# Patient Record
Sex: Male | Born: 1990
Health system: Southern US, Community
[De-identification: ages and names within clinical notes are randomized; demographics above are authoritative.]

## PROBLEM LIST (undated history)

## (undated) DIAGNOSIS — M549 Dorsalgia, unspecified: Secondary | ICD-10-CM

## (undated) DIAGNOSIS — R7303 Prediabetes: Secondary | ICD-10-CM

## (undated) HISTORY — DX: Dorsalgia, unspecified: M54.9

## (undated) HISTORY — DX: Prediabetes: R73.03

## (undated) HISTORY — PX: WISDOM TOOTH EXTRACTION: SHX21

---
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
## 2011-05-07 ENCOUNTER — Ambulatory Visit (INDEPENDENT_AMBULATORY_CARE_PROVIDER_SITE_OTHER): Payer: Medicaid Other

## 2011-05-07 ENCOUNTER — Inpatient Hospital Stay (INDEPENDENT_AMBULATORY_CARE_PROVIDER_SITE_OTHER)
Admission: RE | Admit: 2011-05-07 | Discharge: 2011-05-07 | Disposition: A | Discharge status: Home / Self Care | Payer: Medicaid Other | Source: Ambulatory Visit | Attending: Family Medicine | Admitting: Family Medicine

## 2011-05-07 DIAGNOSIS — J069 Acute upper respiratory infection, unspecified: Secondary | ICD-10-CM

## 2011-05-07 DIAGNOSIS — J40 Bronchitis, not specified as acute or chronic: Secondary | ICD-10-CM

## 2013-03-21 ENCOUNTER — Emergency Department (HOSPITAL_COMMUNITY): Payer: Medicaid Other

## 2013-03-21 ENCOUNTER — Encounter (HOSPITAL_COMMUNITY): Payer: Self-pay | Admitting: Emergency Medicine

## 2013-03-21 ENCOUNTER — Emergency Department (HOSPITAL_COMMUNITY)
Admission: EM | Admit: 2013-03-21 | Discharge: 2013-03-21 | Disposition: A | Discharge status: Home / Self Care | Payer: Medicaid Other | Attending: Emergency Medicine | Admitting: Emergency Medicine

## 2013-03-21 DIAGNOSIS — S01112A Laceration without foreign body of left eyelid and periocular area, initial encounter: Secondary | ICD-10-CM

## 2013-03-21 DIAGNOSIS — S0511XA Contusion of eyeball and orbital tissues, right eye, initial encounter: Secondary | ICD-10-CM

## 2013-03-21 DIAGNOSIS — S058X9A Other injuries of unspecified eye and orbit, initial encounter: Secondary | ICD-10-CM | POA: Insufficient documentation

## 2013-03-21 DIAGNOSIS — S0512XA Contusion of eyeball and orbital tissues, left eye, initial encounter: Secondary | ICD-10-CM

## 2013-03-21 DIAGNOSIS — Z23 Encounter for immunization: Secondary | ICD-10-CM | POA: Insufficient documentation

## 2013-03-21 DIAGNOSIS — S022XXA Fracture of nasal bones, initial encounter for closed fracture: Secondary | ICD-10-CM

## 2013-03-21 DIAGNOSIS — S0010XA Contusion of unspecified eyelid and periocular area, initial encounter: Secondary | ICD-10-CM | POA: Insufficient documentation

## 2013-03-21 MED ORDER — SODIUM CHLORIDE 0.9 % IV BOLUS (SEPSIS)
1000.0000 mL | Freq: Once | INTRAVENOUS | Status: AC
  Administered 2013-03-21: 1000 mL via INTRAVENOUS

## 2013-03-21 MED ORDER — IBUPROFEN 800 MG PO TABS: 800.0000 mg | ORAL_TABLET | Freq: Four times a day (QID) | ORAL | Status: DC | PRN

## 2013-03-21 MED ORDER — SODIUM CHLORIDE 0.9 % IV BOLUS (SEPSIS): 1000.0000 mL | Freq: Once | INTRAVENOUS | Status: DC

## 2013-03-21 MED ORDER — TRAMADOL HCL 50 MG PO TABS: 50.0000 mg | ORAL_TABLET | Freq: Four times a day (QID) | ORAL | Status: DC | PRN

## 2013-03-21 MED ORDER — ERYTHROMYCIN 5 MG/GM OP OINT
TOPICAL_OINTMENT | Freq: Once | OPHTHALMIC | Status: AC
  Administered 2013-03-21: 1 via OPHTHALMIC
  Filled 2013-03-21: qty 1

## 2013-03-21 MED ORDER — MORPHINE SULFATE 4 MG/ML IJ SOLN
4.0000 mg | Freq: Once | INTRAMUSCULAR | Status: AC
  Administered 2013-03-21: 4 mg via INTRAVENOUS
  Filled 2013-03-21: qty 1

## 2013-03-21 MED ORDER — TETANUS-DIPHTH-ACELL PERTUSSIS 5-2.5-18.5 LF-MCG/0.5 IM SUSP
0.5000 mL | Freq: Once | INTRAMUSCULAR | Status: AC
  Administered 2013-03-21: 0.5 mL via INTRAMUSCULAR
  Filled 2013-03-21: qty 0.5

## 2013-03-21 MED ORDER — OXYCODONE-ACETAMINOPHEN 5-325 MG PO TABS
1.0000 | ORAL_TABLET | Freq: Once | ORAL | Status: AC
  Administered 2013-03-21: 1 via ORAL
  Filled 2013-03-21: qty 1

## 2013-03-21 NOTE — Procedures (Signed)
Anesthesia: Proparicaine and local 2% lidocaine with epinephrine, 2 mL. EBL: Less than 5 mL Complications: None.  Description: Eyelids and periocular area were cleaned with povidone iodine. Sterile drape was applied. Laceration was explored with forceps. Posterior aspect of laceration through tarsus extended more superiorly than anterior aspect. Westcott scissors were used to dissect orbicularis from anterior face of tarsal plate to allow closure. Two sutures of 6-0 Vicryl were placed in a partial thickness fashion through the tarsus to reapproximate. A 6-0 vicryl suture was then placed at the lid margin to reapproximate the gray line, and suture end was cut long. Two sutures of 6-0 chromic gut were placed to close the skin and incorporate the tail of the vicryl from the marginal suture. Drape removed and area dressed with Erythromycin ophthalmic ointment.
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
## 2013-03-21 NOTE — ED Notes (Signed)
Optho consult     Brandt Loosen, MD 03/21/13 [phone]
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
## 2014-08-08 ENCOUNTER — Encounter (HOSPITAL_COMMUNITY): Payer: Self-pay | Admitting: Emergency Medicine

## 2014-08-08 ENCOUNTER — Emergency Department (INDEPENDENT_AMBULATORY_CARE_PROVIDER_SITE_OTHER): Payer: BC Managed Care – PPO

## 2014-08-08 ENCOUNTER — Emergency Department (INDEPENDENT_AMBULATORY_CARE_PROVIDER_SITE_OTHER)
Admission: EM | Admit: 2014-08-08 | Discharge: 2014-08-08 | Disposition: A | Discharge status: Home / Self Care | Payer: BC Managed Care – PPO | Source: Home / Self Care | Attending: Family Medicine | Admitting: Family Medicine

## 2014-08-08 DIAGNOSIS — S6980XA Other specified injuries of unspecified wrist, hand and finger(s), initial encounter: Secondary | ICD-10-CM

## 2014-08-08 DIAGNOSIS — S6990XA Unspecified injury of unspecified wrist, hand and finger(s), initial encounter: Secondary | ICD-10-CM

## 2014-08-08 DIAGNOSIS — S6992XA Unspecified injury of left wrist, hand and finger(s), initial encounter: Secondary | ICD-10-CM

## 2014-08-08 DIAGNOSIS — Y9362 Activity, american flag or touch football: Secondary | ICD-10-CM

## 2014-08-08 MED ORDER — DICLOFENAC SODIUM 75 MG PO TBEC
75.0000 mg | DELAYED_RELEASE_TABLET | Freq: Two times a day (BID) | ORAL | Status: DC | PRN
Start: 2014-08-08 — End: 2016-05-21

## 2014-08-08 NOTE — Discharge Instructions (Signed)
Thank you for coming in today. Take diclofenac twice daily as needed.  Followup with the hand surgery practice.   Contusion A contusion is a deep bruise. Contusions are the result of an injury that caused bleeding under the skin. The contusion may turn blue, purple, or yellow. Minor injuries will give you a painless contusion, but more severe contusions may stay painful and swollen for a few weeks.  CAUSES  A contusion is usually caused by a blow, trauma, or direct force to an area of the body. SYMPTOMS   Swelling and redness of the injured area.  Bruising of the injured area.  Tenderness and soreness of the injured area.  Pain. DIAGNOSIS  The diagnosis can be made by taking a history and physical exam. An X-ray, CT scan, or MRI may be needed to determine if there were any associated injuries, such as fractures. TREATMENT  Specific treatment will depend on what area of the body was injured. In general, the best treatment for a contusion is resting, icing, elevating, and applying cold compresses to the injured area. Over-the-counter medicines may also be recommended for pain control. Ask your caregiver what the best treatment is for your contusion. HOME CARE INSTRUCTIONS   Put ice on the injured area.  Put ice in a plastic bag.  Place a towel between your skin and the bag.  Leave the ice on for 15-20 minutes, 3-4 times a day, or as directed by your health care provider.  Only take over-the-counter or prescription medicines for pain, discomfort, or fever as directed by your caregiver. Your caregiver may recommend avoiding anti-inflammatory medicines (aspirin, ibuprofen, and naproxen) for 48 hours because these medicines may increase bruising.  Rest the injured area.  If possible, elevate the injured area to reduce swelling. SEEK IMMEDIATE MEDICAL CARE IF:   You have increased bruising or swelling.  You have pain that is getting worse.  Your swelling or pain is not relieved  with medicines. MAKE SURE YOU:   Understand these instructions.  Will watch your condition.  Will get help right away if you are not doing well or get worse. Document Released: 09/18/2005 Document Revised: 12/14/2013 Document Reviewed: 10/14/2011 [facility] Patient Information 2015 [employer], [place]. This information is not intended to replace advice given to you by your health care provider. Make sure you discuss any questions you have with your health care provider.

## 2014-08-08 NOTE — ED Provider Notes (Signed)
Jose Malone is a 23 y.o. right-hand-dominant male who presents to Urgent Care today for Left thumb pain. Patient dislocated his left thumb at the interphalangeal joint approximately 2 months ago while playing flag football. He relocated himself. Since the initial injury is noted continued pain and swelling at the thumb. He denies any radiating pain weakness or numbness. He's tried some ibuprofen which helps some.   History reviewed. No pertinent past medical history. History  Substance Use Topics  . Smoking status: Never Smoker   . Smokeless tobacco: Not on file  . Alcohol Use: Yes   ROS as above Medications: No current facility-administered medications for this encounter.   Current Outpatient Prescriptions  Medication Sig Dispense Refill  . diclofenac (VOLTAREN) 75 MG EC tablet Take 1 tablet (75 mg total) by mouth 2 (two) times daily as needed.  30 tablet  0  . ibuprofen (ADVIL,MOTRIN) 800 MG tablet Take 1 tablet (800 mg total) by mouth every 6 (six) hours as needed for pain.  21 tablet  0  . traMADol (ULTRAM) 50 MG tablet Take 1 tablet (50 mg total) by mouth every 6 (six) hours as needed for pain.  30 tablet  0    Exam:  BP 130/85  Pulse 69  Temp(Src) 98.5 F (36.9 C) (Oral)  Resp 16  SpO2 98% Gen: Well NAD Left thumb: normal appearing with some swelling at the interphalangeal joint. Mildly tender. Normal motion. Capillary refill sensation are intact. Pulses are intact at the wrist.  No results found for this or any previous visit (from the past 24 hour(s)). Dg Finger Thumb Left  08/08/2014   CLINICAL DATA:  Pain post trauma  EXAM: LEFT THUMB 2+V  COMPARISON:  None.  FINDINGS: Frontal, oblique, and lateral views were obtained. No fracture or dislocation. Joint spaces appear intact. No erosive change.  IMPRESSION: No abnormality noted.   Electronically Signed   By: Bretta BangWilliam  Woodruff M.D.   On: 08/08/2014 14:09    Assessment and Plan: 23 y.o. male with left thumb injury. Continue  to have pain. Plan for followup with hand surgery clinic. NSAIDs for pain control as needed.  Discussed warning signs or symptoms. Please see discharge instructions. Patient expresses understanding.   This note was created using Conservation officer, historic buildingsDragon voice recognition software. Any transcription errors are unintended.    Rodolph BongEvan S Calder Oblinger, MD 08/08/14 [id number]

## 2014-08-08 NOTE — ED Notes (Signed)
Pt c/o left thumb inj/dislocation back in June 20th?? Reports she was playing flag football; hyperextended thumb teammate placed thumb back in place Alert; no signs of acute distress.

## 2015-03-19 ENCOUNTER — Encounter (HOSPITAL_COMMUNITY): Payer: Self-pay | Admitting: Emergency Medicine

## 2015-03-19 ENCOUNTER — Emergency Department (HOSPITAL_COMMUNITY)
Admission: EM | Admit: 2015-03-19 | Discharge: 2015-03-20 | Disposition: A | Discharge status: Home / Self Care | Payer: BLUE CROSS/BLUE SHIELD | Attending: Emergency Medicine | Admitting: Emergency Medicine

## 2015-03-19 DIAGNOSIS — Z791 Long term (current) use of non-steroidal anti-inflammatories (NSAID): Secondary | ICD-10-CM | POA: Insufficient documentation

## 2015-03-19 DIAGNOSIS — J069 Acute upper respiratory infection, unspecified: Secondary | ICD-10-CM | POA: Insufficient documentation

## 2015-03-19 DIAGNOSIS — Z79899 Other long term (current) drug therapy: Secondary | ICD-10-CM | POA: Insufficient documentation

## 2015-03-19 DIAGNOSIS — H578 Other specified disorders of eye and adnexa: Secondary | ICD-10-CM | POA: Diagnosis present

## 2015-03-19 DIAGNOSIS — H109 Unspecified conjunctivitis: Secondary | ICD-10-CM | POA: Insufficient documentation

## 2015-03-19 NOTE — ED Notes (Signed)
C/o L eye redness and itching since this morning.  Also reports sore throat since Friday.

## 2015-03-19 NOTE — ED Provider Notes (Signed)
CSN: [id number]     Arrival date & time 03/19/15  2339 History  This chart was scribed for non-physician practitioner, Jaynie Crumble, PA-C working with Dione Booze, MD by Greggory Stallion, ED scribe. This patient was seen in room TR05C/TR05C and the patient's care was started at 11:52 PM.   Chief Complaint  Patient presents with  . Conjunctivitis  . Sore Throat   The history is provided by the patient. No language interpreter was used.    HPI Comments: Jose Malone is a 24 y.o. male who presents to the Emergency Department complaining of left eye redness, itching and aching pain that started this morning. He does not wear contacts or glasses. Pt also reports sore throat and mild nasal congestion that started 2 days ago. He has used cough drops with no relief. Pt denies fever, cough. He denies sick contacts.   History reviewed. No pertinent past medical history. Past Surgical History  Procedure Laterality Date  . Wisdom tooth extraction     No family history on file. History  Substance Use Topics  . Smoking status: Never Smoker   . Smokeless tobacco: Not on file  . Alcohol Use: Yes    Review of Systems  Constitutional: Negative for fever.  HENT: Positive for congestion and sore throat.   Eyes: Positive for pain, redness and itching.  Respiratory: Negative for cough.   All other systems reviewed and are negative.  Allergies  Review of patient's allergies indicates no known allergies.  Home Medications   Prior to Admission medications   Medication Sig Start Date End Date Taking? Authorizing Provider  diclofenac (VOLTAREN) 75 MG EC tablet Take 1 tablet (75 mg total) by mouth 2 (two) times daily as needed. 08/08/14   Rodolph Bong, MD  ibuprofen (ADVIL,MOTRIN) 800 MG tablet Take 1 tablet (800 mg total) by mouth every 6 (six) hours as needed for pain. 03/21/13   Brandt Loosen, MD  traMADol (ULTRAM) 50 MG tablet Take 1 tablet (50 mg total) by mouth every 6 (six) hours as needed for  pain. 03/21/13   Brandt Loosen, MD   BP 124/77 mmHg  Pulse 76  Temp(Src) 98.1 F (36.7 C) (Oral)  Resp 18  Ht  (1.905 m)  Wt 256 lb (116.121 kg)  BMI 32.00 kg/m2  SpO2 96%   Physical Exam  Constitutional: He is oriented to person, place, and time. He appears well-developed and well-nourished. No distress.  HENT:  Head: Normocephalic and atraumatic.  Right Ear: Tympanic membrane and ear canal normal.  Left Ear: Tympanic membrane and ear canal normal.  Oropharynx erythematous. Tonsils NOT enlarged. No exudate.  Eyes: EOM and lids are normal. Pupils are equal, round, and reactive to light. Lids are everted and swept, no foreign bodies found. Left eye exhibits no exudate. No foreign body present in the left eye. Left conjunctiva is injected.  Neck: Neck supple. No tracheal deviation present.  Cardiovascular: Normal rate, regular rhythm and normal heart sounds.   Pulmonary/Chest: Effort normal and breath sounds normal. No respiratory distress.  Musculoskeletal: Normal range of motion.  Lymphadenopathy:    He has no cervical adenopathy.  Neurological: He is alert and oriented to person, place, and time.  Skin: Skin is warm and dry.  Psychiatric: He has a normal mood and affect. His behavior is normal.  Nursing note and vitals reviewed.   ED Course  Procedures (including critical care time)  DIAGNOSTIC STUDIES: Oxygen Saturation is 96% on RA, normal by my interpretation.  COORDINATION OF CARE: 11:55 PM-Discussed treatment plan which includes rapid strep test with pt at bedside and pt agreed to plan.   Labs Review Labs Reviewed - No data to display  Imaging Review No results found.  Visual Acuity    Bilateral Near             Bilateral Distance  20/25           R Near             R Distance  20/15           L Near             L Distance  20/30               MDM   Final diagnoses:  Viral URI  Conjunctivitis, left eye   patient with left eye injection,  itching. No injury. No pain. No visual disturbances. Also with some throat. Exam is unremarkable. Strep screen is negative. Will treat left eye with erythromycin ointment. Symptomatic treatment for his sore throat. Most likely viral in nature. Follow-up with primary care doctor. Vital signs are normal, patient is afebrile.   Filed Vitals:   03/19/15 2344 03/20/15 0046  BP: 124/77 113/52  Pulse: 76 79  Temp: 98.1 F (36.7 C) 97.5 F (36.4 C)  TempSrc: Oral Oral  Resp: 18 16  Height: 6\' 3"  (1.905 m)   Weight: 256 lb (116.121 kg)   SpO2: 96% 98%    I personally performed the services described in this documentation, which was scribed in my presence. The recorded information has been reviewed and is accurate.   Jaynie Crumbleatyana Aubrielle Stroud, PA-C 03/20/15 0126  Dione Boozeavid Glick, MD 03/20/15 0630

## 2015-03-20 LAB — RAPID STREP SCREEN (MED CTR MEBANE ONLY): STREPTOCOCCUS, GROUP A SCREEN (DIRECT): NEGATIVE

## 2015-03-20 MED ORDER — ERYTHROMYCIN 5 MG/GM OP OINT
1.0000 "application " | TOPICAL_OINTMENT | Freq: Four times a day (QID) | OPHTHALMIC | Status: DC
  Administered 2015-03-20: 1 via OPHTHALMIC
  Filled 2015-03-20: qty 3.5

## 2015-03-20 NOTE — Discharge Instructions (Signed)
He uses erythromycin ointment up to 6 times a day in the left eye. Make sure to wash her hands anytime you touch her face. Take ibuprofen or Tylenol for pain. Saltwater gargles for throat. Follow-up with primary care doctor for recheck if not improving.   Conjunctivitis Conjunctivitis is commonly called "pink eye." Conjunctivitis can be caused by bacterial or viral infection, allergies, or injuries. There is usually redness of the lining of the eye, itching, discomfort, and sometimes discharge. There may be deposits of matter along the eyelids. A viral infection usually causes a watery discharge, while a bacterial infection causes a yellowish, thick discharge. Pink eye is very contagious and spreads by direct contact. You may be given antibiotic eyedrops as part of your treatment. Before using your eye medicine, remove all drainage from the eye by washing gently with warm water and cotton balls. Continue to use the medication until you have awakened 2 mornings in a row without discharge from the eye. Do not rub your eye. This increases the irritation and helps spread infection. Use separate towels from other household members. Wash your hands with soap and water before and after touching your eyes. Use cold compresses to reduce pain and sunglasses to relieve irritation from light. Do not wear contact lenses or wear eye makeup until the infection is gone. SEEK MEDICAL CARE IF:   Your symptoms are not better after 3 days of treatment.  You have increased pain or trouble seeing.  The outer eyelids become very red or swollen. Document Released: 01/16/2005 Document Revised: 03/02/2012 Document Reviewed: 12/09/2005 [facility] Patient Information 2015 [employer] [place]. This information is not intended to replace advice given to you by your health care provider. Make sure you discuss any questions you have with your health care provider.  Pharyngitis Pharyngitis is redness, pain, and swelling (inflammation) of  your pharynx.  CAUSES  Pharyngitis is usually caused by infection. Most of the time, these infections are from viruses (viral) and are part of a cold. However, sometimes pharyngitis is caused by bacteria (bacterial). Pharyngitis can also be caused by allergies. Viral pharyngitis may be spread from person to person by coughing, sneezing, and personal items or utensils (cups, forks, spoons, toothbrushes). Bacterial pharyngitis may be spread from person to person by more intimate contact, such as kissing.  SIGNS AND SYMPTOMS  Symptoms of pharyngitis include:   Sore throat.   Tiredness (fatigue).   Low-grade fever.   Headache.  Joint pain and muscle aches.  Skin rashes.  Swollen lymph nodes.  Plaque-like film on throat or tonsils (often seen with bacterial pharyngitis). DIAGNOSIS  Your health care provider will ask you questions about your illness and your symptoms. Your medical history, along with a physical exam, is often all that is needed to diagnose pharyngitis. Sometimes, a rapid strep test is done. Other lab tests may also be done, depending on the suspected cause.  TREATMENT  Viral pharyngitis will usually get better in 3-4 days without the use of medicine. Bacterial pharyngitis is treated with medicines that kill germs (antibiotics).  HOME CARE INSTRUCTIONS   Drink enough water and fluids to keep your urine clear or pale yellow.   Only take over-the-counter or prescription medicines as directed by your health care provider:   If you are prescribed antibiotics, make sure you finish them even if you start to feel better.   Do not take aspirin.   Get lots of rest.   Gargle with 8 oz of salt water ( tsp of salt  per 1 qt of water) as often as every 1-2 hours to soothe your throat.   Throat lozenges (if you are not at risk for choking) or sprays may be used to soothe your throat. SEEK MEDICAL CARE IF:   You have large, tender lumps in your neck.  You have a  rash.  You cough up green, yellow-brown, or bloody spit. SEEK IMMEDIATE MEDICAL CARE IF:   Your neck becomes stiff.  You drool or are unable to swallow liquids.  You vomit or are unable to keep medicines or liquids down.  You have severe pain that does not go away with the use of recommended medicines.  You have trouble breathing (not caused by a stuffy nose). MAKE SURE YOU:   Understand these instructions.  Will watch your condition.  Will get help right away if you are not doing well or get worse. Document Released: 12/09/2005 Document Revised: 09/29/2013 Document Reviewed: 08/16/2013 [facility] Patient Information 2015 [place], [place]. This information is not intended to replace advice given to you by your health care provider. Make sure you discuss any questions you have with your health care provider.

## 2015-03-22 LAB — CULTURE, GROUP A STREP: STREP A CULTURE: NEGATIVE

## 2016-05-21 ENCOUNTER — Ambulatory Visit (INDEPENDENT_AMBULATORY_CARE_PROVIDER_SITE_OTHER): Payer: 59 | Admitting: Family Medicine

## 2016-05-21 VITALS — BP 120/72 | HR 76 | Temp 99.1°F | Resp 18 | Ht 74.0 in | Wt 267.4 lb

## 2016-05-21 DIAGNOSIS — M545 Low back pain, unspecified: Secondary | ICD-10-CM

## 2016-05-21 DIAGNOSIS — R809 Proteinuria, unspecified: Secondary | ICD-10-CM

## 2016-05-21 LAB — POCT URINALYSIS DIP (MANUAL ENTRY)
Bilirubin, UA: NEGATIVE
Glucose, UA: NEGATIVE
Ketones, POC UA: NEGATIVE
Leukocytes, UA: NEGATIVE
Nitrite, UA: NEGATIVE
PH UA: 6
Protein Ur, POC: 100 — AB
RBC UA: NEGATIVE
SPEC GRAV UA: 1.025
Urobilinogen, UA: 1

## 2016-05-21 LAB — COMPREHENSIVE METABOLIC PANEL
ALT: 16 U/L (ref 9–46)
AST: 17 U/L (ref 10–40)
Albumin: 4.3 g/dL (ref 3.6–5.1)
Alkaline Phosphatase: 69 U/L (ref 40–115)
BILIRUBIN TOTAL: 0.8 mg/dL (ref 0.2–1.2)
BUN: 10 mg/dL (ref 7–25)
CALCIUM: 9.2 mg/dL (ref 8.6–10.3)
CHLORIDE: 101 mmol/L (ref 98–110)
CO2: 27 mmol/L (ref 20–31)
CREATININE: 1.18 mg/dL (ref 0.60–1.35)
GLUCOSE: 81 mg/dL (ref 65–99)
Potassium: 3.9 mmol/L (ref 3.5–5.3)
SODIUM: 137 mmol/L (ref 135–146)
Total Protein: 6.9 g/dL (ref 6.1–8.1)

## 2016-05-21 LAB — CK: Total CK: 278 U/L — ABNORMAL HIGH (ref 7–232)

## 2016-05-21 MED ORDER — CYCLOBENZAPRINE HCL 10 MG PO TABS: 10.0000 mg | ORAL_TABLET | Freq: Three times a day (TID) | ORAL | Status: DC | PRN

## 2016-05-21 MED ORDER — HYDROCODONE-ACETAMINOPHEN 5-325 MG PO TABS: 1.0000 | ORAL_TABLET | Freq: Four times a day (QID) | ORAL | Status: DC | PRN

## 2016-05-21 MED ORDER — DICLOFENAC SODIUM 75 MG PO TBEC: 75.0000 mg | DELAYED_RELEASE_TABLET | Freq: Two times a day (BID) | ORAL | Status: DC

## 2016-05-21 NOTE — Patient Instructions (Addendum)
Proteinuria Proteinuria is a condition in which urine contains more protein than is normal. Proteinuria is either a sign that your body is producing too much protein or a sign that there is a problem with the kidneys. Healthy kidneys prevent most substances that the body needs, including proteins, from leaving the bloodstream and ending up in urine. CAUSES  Proteinuria may be caused by a temporary event or condition such as stress, exercise, or fever, and go away on its own. Proteinuria may also be a symptom of a more serious condition or disease. Causes of proteinuria include:  A kidney disease caused by:  Diabetes.  High blood pressure (hypertension).   A disease that affects the immune system, such as lupus.  A genetic disease, such as Alport's syndrome.  Medicines that damage the kidneys, such as long-term nonsteroidal anti-inflammatory drugs (NSAIDs).  Poisoning or exposure to toxic substances.  A reoccurring kidney or urinary infection.  Excess protein production in the body caused by:  Multiple myeloma.  Amyloidosis. SYMPTOMS You may have proteinuria without having noticeable symptoms. If there is a large amount of protein in your urine, your urine may look foamy. You may also notice swelling (edema) in your hands, feet, abdomen, or face. DIAGNOSIS To determine whether you have proteinuria, you will need to provide a urine sample. Your urine will then be tested for too much protein and the main blood protein albumin. If your test shows that you have proteinuria, you may need to take additional tests to determine its cause, how much protein is in your urine, and what type of protein is being lost. Tests may include:  Blood tests.  Urine tests.  A blood pressure measurement.  Imaging tests. TREATMENT  Treatment will depend on the cause of your proteinuria. Your caregiver will discuss treatment options with you after you have been diagnosed. If your proteinuria is mild or  temporary, no treatment may be necessary. HOME CARE INSTRUCTIONS Ask your caregiver if monitoring the level of protein in your urine at home using simple testing strips is appropriate for you. Early detection of proteinuria can lead to early and often successful treatment of the condition causing it.   This information is not intended to replace advice given to you by your health care provider. Make sure you discuss any questions you have with your health care provider.   Document Released: 01/29/2006 Document Revised: 09/02/2012 Document Reviewed: 05/08/2012 Elsevier Interactive Patient Education 2016 [employer] Strain With Rehab A strain is an injury in which a tendon or muscle is torn. The muscles and tendons of the lower back are vulnerable to strains. However, these muscles and tendons are very strong and require a great force to be injured. Strains are classified into three categories. Grade 1 strains cause pain, but the tendon is not lengthened. Grade 2 strains include a lengthened ligament, due to the ligament being stretched or partially ruptured. With grade 2 strains there is still function, although the function may be decreased. Grade 3 strains involve a complete tear of the tendon or muscle, and function is usually impaired. SYMPTOMS   Pain in the lower back.  Pain that affects one side more than the other.  Pain that gets worse with movement and may be felt in the hip, buttocks, or back of the thigh.  Muscle spasms of the muscles in the back.  Swelling along the muscles of the back.  Loss of strength of the back muscles.  Crackling sound (crepitation)  when the muscles are touched. CAUSES  Lower back strains occur when a force is placed on the muscles or tendons that is greater than they can handle. Common causes of injury include:  Prolonged overuse of the muscle-tendon units in the lower back, usually from incorrect posture.  A single violent injury or  force applied to the back. RISK INCREASES WITH:  Sports that involve twisting forces on the spine or a lot of bending at the waist (football, rugby, weightlifting, bowling, golf, tennis, speed skating, racquetball, swimming, running, gymnastics, diving).  Poor strength and flexibility.  Failure to warm up properly before activity.  Family history of lower back pain or disk disorders.  Previous back injury or surgery (especially fusion).  Poor posture with lifting, especially heavy objects.  Prolonged sitting, especially with poor posture. PREVENTION   Learn and use proper posture when sitting or lifting (maintain proper posture when sitting, lift using the knees and legs, not at the waist).  Warm up and stretch properly before activity.  Allow for adequate recovery between workouts.  Maintain physical fitness:  Strength, flexibility, and endurance.  Cardiovascular fitness. PROGNOSIS  If treated properly, lower back strains usually heal within 6 weeks. RELATED COMPLICATIONS   Recurring symptoms, resulting in a chronic problem.  Chronic inflammation, scarring, and partial muscle-tendon tear.  Delayed healing or resolution of symptoms.  Prolonged disability. TREATMENT  Treatment first involves the use of ice and medicine, to reduce pain and inflammation. The use of strengthening and stretching exercises may help reduce pain with activity. These exercises may be performed at home or with a therapist. Severe injuries may require referral to a therapist for further evaluation and treatment, such as ultrasound. Your caregiver may advise that you wear a back brace or corset, to help reduce pain and discomfort. Often, prolonged bed rest results in greater harm then benefit. Corticosteroid injections may be recommended. However, these should be reserved for the most serious cases. It is important to avoid using your back when lifting objects. At night, sleep on your back on a firm  mattress with a pillow placed under your knees. If non-surgical treatment is unsuccessful, surgery may be needed.  MEDICATION   If pain medicine is needed, nonsteroidal anti-inflammatory medicines (aspirin and ibuprofen), or other minor pain relievers (acetaminophen), are often advised.  Do not take pain medicine for 7 days before surgery.  Prescription pain relievers may be given, if your caregiver thinks they are needed. Use only as directed and only as much as you need.  Ointments applied to the skin may be helpful.  Corticosteroid injections may be given by your caregiver. These injections should be reserved for the most serious cases, because they may only be given a certain number of times. HEAT AND COLD  Cold treatment (icing) should be applied for 10 to 15 minutes every 2 to 3 hours for inflammation and pain, and immediately after activity that aggravates your symptoms. Use ice packs or an ice massage.  Heat treatment may be used before performing stretching and strengthening activities prescribed by your caregiver, physical therapist, or athletic trainer. Use a heat pack or a warm water soak. SEEK MEDICAL CARE IF:   Symptoms get worse or do not improve in 2 to 4 weeks, despite treatment.  You develop numbness, weakness, or loss of bowel or bladder function.  New, unexplained symptoms develop. (Drugs used in treatment may produce side effects.) EXERCISES  RANGE OF MOTION (ROM) AND STRETCHING EXERCISES - Low Back Strain Most people  with lower back pain will find that their symptoms get worse with excessive bending forward (flexion) or arching at the lower back (extension). The exercises which will help resolve your symptoms will focus on the opposite motion.  Your physician, physical therapist or athletic trainer will help you determine which exercises will be most helpful to resolve your lower back pain. Do not complete any exercises without first consulting with your caregiver.  Discontinue any exercises which make your symptoms worse until you speak to your caregiver.  If you have pain, numbness or tingling which travels down into your buttocks, leg or foot, the goal of the therapy is for these symptoms to move closer to your back and eventually resolve. Sometimes, these leg symptoms will get better, but your lower back pain may worsen. This is typically an indication of progress in your rehabilitation. Be very alert to any changes in your symptoms and the activities in which you participated in the 24 hours prior to the change. Sharing this information with your caregiver will allow him/her to most efficiently treat your condition.  These exercises may help you when beginning to rehabilitate your injury. Your symptoms may resolve with or without further involvement from your physician, physical therapist or athletic trainer. While completing these exercises, remember:  Restoring tissue flexibility helps normal motion to return to the joints. This allows healthier, less painful movement and activity.  An effective stretch should be held for at least 30 seconds.  A stretch should never be painful. You should only feel a gentle lengthening or release in the stretched tissue. FLEXION RANGE OF MOTION AND STRETCHING EXERCISES: STRETCH - Flexion, Single Knee to Chest   Lie on a firm bed or floor with both legs extended in front of you.  Keeping one leg in contact with the floor, bring your opposite knee to your chest. Hold your leg in place by either grabbing behind your thigh or at your knee.  Pull until you feel a gentle stretch in your lower back. Hold __________ seconds.  Slowly release your grasp and repeat the exercise with the opposite side. Repeat __________ times. Complete this exercise __________ times per day.  STRETCH - Flexion, Double Knee to Chest   Lie on a firm bed or floor with both legs extended in front of you.  Keeping one leg in contact with the  floor, bring your opposite knee to your chest.  Tense your stomach muscles to support your back and then lift your other knee to your chest. Hold your legs in place by either grabbing behind your thighs or at your knees.  Pull both knees toward your chest until you feel a gentle stretch in your lower back. Hold __________ seconds.  Tense your stomach muscles and slowly return one leg at a time to the floor. Repeat __________ times. Complete this exercise __________ times per day.  STRETCH - Low Trunk Rotation  Lie on a firm bed or floor. Keeping your legs in front of you, bend your knees so they are both pointed toward the ceiling and your feet are flat on the floor.  Extend your arms out to the side. This will stabilize your upper body by keeping your shoulders in contact with the floor.  Gently and slowly drop both knees together to one side until you feel a gentle stretch in your lower back. Hold for __________ seconds.  Tense your stomach muscles to support your lower back as you bring your knees back to the starting position.  Repeat the exercise to the other side. Repeat __________ times. Complete this exercise __________ times per day  EXTENSION RANGE OF MOTION AND FLEXIBILITY EXERCISES: STRETCH - Extension, Prone on Elbows   Lie on your stomach on the floor, a bed will be too soft. Place your palms about shoulder width apart and at the height of your head.  Place your elbows under your shoulders. If this is too painful, stack pillows under your chest.  Allow your body to relax so that your hips drop lower and make contact more completely with the floor.  Hold this position for __________ seconds.  Slowly return to lying flat on the floor. Repeat __________ times. Complete this exercise __________ times per day.  RANGE OF MOTION - Extension, Prone Press Ups  Lie on your stomach on the floor, a bed will be too soft. Place your palms about shoulder width apart and at the height  of your head.  Keeping your back as relaxed as possible, slowly straighten your elbows while keeping your hips on the floor. You may adjust the placement of your hands to maximize your comfort. As you gain motion, your hands will come more underneath your shoulders.  Hold this position __________ seconds.  Slowly return to lying flat on the floor. Repeat __________ times. Complete this exercise __________ times per day.  RANGE OF MOTION- Quadruped, Neutral Spine   Assume a hands and knees position on a firm surface. Keep your hands under your shoulders and your knees under your hips. You may place padding under your knees for comfort.  Drop your head and point your tail bone toward the ground below you. This will round out your lower back like an angry cat. Hold this position for __________ seconds.  Slowly lift your head and release your tail bone so that your back sags into a large arch, like an old horse.  Hold this position for __________ seconds.  Repeat this until you feel limber in your lower back.  Now, find your "sweet spot." This will be the most comfortable position somewhere between the two previous positions. This is your neutral spine. Once you have found this position, tense your stomach muscles to support your lower back.  Hold this position for __________ seconds. Repeat __________ times. Complete this exercise __________ times per day.  STRENGTHENING EXERCISES - Low Back Strain These exercises may help you when beginning to rehabilitate your injury. These exercises should be done near your "sweet spot." This is the neutral, low-back arch, somewhere between fully rounded and fully arched, that is your least painful position. When performed in this safe range of motion, these exercises can be used for people who have either a flexion or extension based injury. These exercises may resolve your symptoms with or without further involvement from your physician, physical therapist  or athletic trainer. While completing these exercises, remember:   Muscles can gain both the endurance and the strength needed for everyday activities through controlled exercises.  Complete these exercises as instructed by your physician, physical therapist or athletic trainer. Increase the resistance and repetitions only as guided.  You may experience muscle soreness or fatigue, but the pain or discomfort you are trying to eliminate should never worsen during these exercises. If this pain does worsen, stop and make certain you are following the directions exactly. If the pain is still present after adjustments, discontinue the exercise until you can discuss the trouble with your caregiver. STRENGTHENING - Deep Abdominals, Pelvic Tilt  Lie on  a firm bed or floor. Keeping your legs in front of you, bend your knees so they are both pointed toward the ceiling and your feet are flat on the floor.  Tense your lower abdominal muscles to press your lower back into the floor. This motion will rotate your pelvis so that your tail bone is scooping upwards rather than pointing at your feet or into the floor.  With a gentle tension and even breathing, hold this position for __________ seconds. Repeat __________ times. Complete this exercise __________ times per day.  STRENGTHENING - Abdominals, Crunches   Lie on a firm bed or floor. Keeping your legs in front of you, bend your knees so they are both pointed toward the ceiling and your feet are flat on the floor. Cross your arms over your chest.  Slightly tip your chin down without bending your neck.  Tense your abdominals and slowly lift your trunk high enough to just clear your shoulder blades. Lifting higher can put excessive stress on the lower back and does not further strengthen your abdominal muscles.  Control your return to the starting position. Repeat __________ times. Complete this exercise __________ times per day.  STRENGTHENING -  Quadruped, Opposite UE/LE Lift   Assume a hands and knees position on a firm surface. Keep your hands under your shoulders and your knees under your hips. You may place padding under your knees for comfort.  Find your neutral spine and gently tense your abdominal muscles so that you can maintain this position. Your shoulders and hips should form a rectangle that is parallel with the floor and is not twisted.  Keeping your trunk steady, lift your right hand no higher than your shoulder and then your left leg no higher than your hip. Make sure you are not holding your breath. Hold this position __________ seconds.  Continuing to keep your abdominal muscles tense and your back steady, slowly return to your starting position. Repeat with the opposite arm and leg. Repeat __________ times. Complete this exercise __________ times per day.  STRENGTHENING - Lower Abdominals, Double Knee Lift  Lie on a firm bed or floor. Keeping your legs in front of you, bend your knees so they are both pointed toward the ceiling and your feet are flat on the floor.  Tense your abdominal muscles to brace your lower back and slowly lift both of your knees until they come over your hips. Be certain not to hold your breath.  Hold __________ seconds. Using your abdominal muscles, return to the starting position in a slow and controlled manner. Repeat __________ times. Complete this exercise __________ times per day.  POSTURE AND BODY MECHANICS CONSIDERATIONS - Low Back Strain Keeping correct posture when sitting, standing or completing your activities will reduce the stress put on different body tissues, allowing injured tissues a chance to heal and limiting painful experiences. The following are general guidelines for improved posture. Your physician or physical therapist will provide you with any instructions specific to your needs. While reading these guidelines, remember:  The exercises prescribed by your provider will  help you have the flexibility and strength to maintain correct postures.  The correct posture provides the best environment for your joints to work. All of your joints have less wear and tear when properly supported by a spine with good posture. This means you will experience a healthier, less painful body.  Correct posture must be practiced with all of your activities, especially prolonged sitting and standing. Correct posture is  as important when doing repetitive low-stress activities (typing) as it is when doing a single heavy-load activity (lifting). RESTING POSITIONS Consider which positions are most painful for you when choosing a resting position. If you have pain with flexion-based activities (sitting, bending, stooping, squatting), choose a position that allows you to rest in a less flexed posture. You would want to avoid curling into a fetal position on your side. If your pain worsens with extension-based activities (prolonged standing, working overhead), avoid resting in an extended position such as sleeping on your stomach. Most people will find more comfort when they rest with their spine in a more neutral position, neither too rounded nor too arched. Lying on a non-sagging bed on your side with a pillow between your knees, or on your back with a pillow under your knees will often provide some relief. Keep in mind, being in any one position for a prolonged period of time, no matter how correct your posture, can still lead to stiffness. PROPER SITTING POSTURE In order to minimize stress and discomfort on your spine, you must sit with correct posture. Sitting with good posture should be effortless for a healthy body. Returning to good posture is a gradual process. Many people can work toward this most comfortably by using various supports until they have the flexibility and strength to maintain this posture on their own. When sitting with proper posture, your ears will fall over your shoulders  and your shoulders will fall over your hips. You should use the back of the chair to support your upper back. Your lower back will be in a neutral position, just slightly arched. You may place a small pillow or folded towel at the base of your lower back for support.  When working at a desk, create an environment that supports good, upright posture. Without extra support, muscles tire, which leads to excessive strain on joints and other tissues. Keep these recommendations in mind: CHAIR:  A chair should be able to slide under your desk when your back makes contact with the back of the chair. This allows you to work closely.  The chair's height should allow your eyes to be level with the upper part of your monitor and your hands to be slightly lower than your elbows. BODY POSITION  Your feet should make contact with the floor. If this is not possible, use a foot rest.  Keep your ears over your shoulders. This will reduce stress on your neck and lower back. INCORRECT SITTING POSTURES  If you are feeling tired and unable to assume a healthy sitting posture, do not slouch or slump. This puts excessive strain on your back tissues, causing more damage and pain. Healthier options include:  Using more support, like a lumbar pillow.  Switching tasks to something that requires you to be upright or walking.  Talking a brief walk.  Lying down to rest in a neutral-spine position. PROLONGED STANDING WHILE SLIGHTLY LEANING FORWARD  When completing a task that requires you to lean forward while standing in one place for a long time, place either foot up on a stationary 2-4 inch high object to help maintain the best posture. When both feet are on the ground, the lower back tends to lose its slight inward curve. If this curve flattens (or becomes too large), then the back and your other joints will experience too much stress, tire more quickly, and can cause pain. CORRECT STANDING POSTURES Proper standing  posture should be assumed with all daily activities,  even if they only take a few moments, like when brushing your teeth. As in sitting, your ears should fall over your shoulders and your shoulders should fall over your hips. You should keep a slight tension in your abdominal muscles to brace your spine. Your tailbone should point down to the ground, not behind your body, resulting in an over-extended swayback posture.  INCORRECT STANDING POSTURES  Common incorrect standing postures include a forward head, locked knees and/or an excessive swayback. WALKING Walk with an upright posture. Your ears, shoulders and hips should all line-up. PROLONGED ACTIVITY IN A FLEXED POSITION When completing a task that requires you to bend forward at your waist or lean over a low surface, try to find a way to stabilize 3 out of 4 of your limbs. You can place a hand or elbow on your thigh or rest a knee on the surface you are reaching across. This will provide you more stability so that your muscles do not fatigue as quickly. By keeping your knees relaxed, or slightly bent, you will also reduce stress across your lower back. CORRECT LIFTING TECHNIQUES DO :   Assume a wide stance. This will provide you more stability and the opportunity to get as close as possible to the object which you are lifting.  Tense your abdominals to brace your spine. Bend at the knees and hips. Keeping your back locked in a neutral-spine position, lift using your leg muscles. Lift with your legs, keeping your back straight.  Test the weight of unknown objects before attempting to lift them.  Try to keep your elbows locked down at your sides in order get the best strength from your shoulders when carrying an object.  Always ask for help when lifting heavy or awkward objects. INCORRECT LIFTING TECHNIQUES DO NOT:   Lock your knees when lifting, even if it is a small object.  Bend and twist. Pivot at your feet or move your feet when  needing to change directions.  Assume that you can safely pick up even a paper clip without proper posture.   This information is not intended to replace advice given to you by your health care provider. Make sure you discuss any questions you have with your health care provider.   Document Released: 12/09/2005 Document Revised: 12/30/2014 Document Reviewed: 03/23/2009 Else[employer] Interactive Patient Education [employer].

## 2016-05-21 NOTE — Progress Notes (Signed)
By signing my name below, I, Mesha Guinyard, attest that this documentation has been prepared under the direction and in the presence of Norberto SorensonEva Laresha Bacorn, MD.  Electronically Signed: Arvilla MarketMesha Guinyard, Medical Scribe. 05/21/2016. 12:05 PM.  Subjective:    Patient ID: Jose Malone, male    DOB: 1991-12-08, 25 y.o.   MRN: [id number]  HPI Chief Complaint  Patient presents with  . Back Pain    lower back x 1 week, per pt pain left then came back worse    HPI Comments: Jose Malone is a 25 y.o. male who presents to the [facility] complaining of severe lower bilateral lower lumbar, upper gluteous back pain onset 1 week ago. 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 Pt had back pain yesterday morning when he woke up, but continued to go to work although the pain worsened throughout the day. Pain worsens with mobility, coughing, laughing, and putting pressure on his right leg. Pt feels weakness in both of his thighs that's described as burning sensation. Pt experienced chills yesterday. Pt took 2 motrin yesterday, 2 advil, and applying OTC heat patches from [facility] that had little to no relief. Pt hasn't done any strenuous activities since onset of symptoms. Pt's matress is okay. Pain alleviates when laying in fetal position. Pt denies dark urine, irregular bm, numnbenss or weakness.  There are no active problems to display for this patient.  No past medical history on file. Past Surgical History  Procedure Laterality Date  . Wisdom tooth extraction     No Known Allergies Prior to Admission medications   Medication Sig Start Date End Date Taking? Authorizing Provider  traMADol (ULTRAM) 50 MG tablet Take 1 tablet (50 mg total) by mouth every 6 (six) hours as needed for pain. 03/21/13  Yes Brandt LoosenJulie Manly, MD   Social History   Social History  . Marital Status: Single    Spouse Name: N/A  . Number of Children: N/A  . Years of  Education: N/A   Occupational History  . Not on file.   Social History Main Topics  . Smoking status: Never Smoker   . Smokeless tobacco: Not on file  . Alcohol Use: Yes  . Drug Use: No  . Sexual Activity: Not on file   Other Topics Concern  . Not on file   Social History Narrative   [facility] 05/21/2016  Decreased Interest 0  Down, Depressed, Hopeless 0  PHQ - 2 Score 0    Review of Systems  Constitutional: Positive for chills (yesterday). Negative for fever and activity change.  Gastrointestinal: Negative for diarrhea and constipation.  Genitourinary: Negative for dysuria, urgency, frequency and difficulty urinating.  Musculoskeletal: Positive for back pain.  Skin: Negative for rash.  Neurological: Positive for weakness.    Objective:  BP 120/72 mmHg  Pulse 76  Temp(Src) 99.1 F (37.3 C) (Oral)  Resp 18  Ht 6\' 2"  (1.88 m)  Wt 267 lb 6.4 oz (121.292 kg)  BMI 34.32 kg/m2  SpO2 99%  Physical Exam  Constitutional: He appears well-developed and well-nourished. No distress.  HENT:  Head: Normocephalic and atraumatic.  Eyes: Conjunctivae are normal.  Neck: Neck supple.  Cardiovascular: Normal rate, regular rhythm, S1 normal, S2 normal and normal heart sounds.  Exam reveals no gallop and no friction rub.   No murmur heard. Pulmonary/Chest: Effort normal. No respiratory distress. He has no wheezes. He  has no rales.  Musculoskeletal:  No pain over the SI joints. Some tendernes over bilateral at lower lumber p-spinal and upper glutial spasms Negative straight leg raise bilaterally Back exam: 75 degrees of flexion 30 degrees of extension Nl lumbar rotation Moderates limitation on lumbar flexion  Neurological: He is alert.  Reflex Scores:      Patellar reflexes are 2+ on the right side and 2+ on the left side.      Achilles reflexes are 2+ on the right side and 2+ on the left side. Lower extremity strength 5/5 bilaterally  Skin: Skin is warm and  dry.  Psychiatric: He has a normal mood and affect. His behavior is normal.  Nursing note and vitals reviewed.  Results for orders placed or performed in visit on 05/21/16  POCT urinalysis dipstick  Result Value Ref Range   Color, UA orange (A) yellow   Clarity, UA clear clear   Glucose, UA negative negative   Bilirubin, UA negative negative   Ketones, POC UA negative negative   Spec Grav, UA 1.025    Blood, UA negative negative   pH, UA 6.0    Protein Ur, POC =100 (A) negative   Urobilinogen, UA 1.0    Nitrite, UA Negative Negative   Leukocytes, UA Negative Negative    Assessment & Plan:   1. Bilateral low back pain without sciatica   2. Proteinuria     Orders Placed This Encounter  Procedures  . Comprehensive metabolic panel  . CK  . POCT urinalysis dipstick    Meds ordered this encounter  Medications  . HYDROcodone-acetaminophen (NORCO/VICODIN) 5-325 MG tablet    Sig: Take 1-2 tablets by mouth every 6 (six) hours as needed for moderate pain.    Dispense:  20 tablet    Refill:  0  . cyclobenzaprine (FLEXERIL) 10 MG tablet    Sig: Take 1 tablet (10 mg total) by mouth 3 (three) times daily as needed for muscle spasms.    Dispense:  30 tablet    Refill:  0  . diclofenac (VOLTAREN) 75 MG EC tablet    Sig: Take 1 tablet (75 mg total) by mouth 2 (two) times daily.    Dispense:  60 tablet    Refill:  0    I personally performed the services described in this documentation, which was scribed in my presence. The recorded information has been reviewed and considered, and addended by me as needed.   Norberto Sorenson, M.D.  [facility] [address] [place], [place] [address] [phone] phone [phone] fax  05/27/2016 12:58 AM

## 2016-06-19 ENCOUNTER — Telehealth: Payer: Self-pay

## 2016-06-19 NOTE — Telephone Encounter (Signed)
Normal, sent to lab pool.

## 2016-06-19 NOTE — Telephone Encounter (Signed)
Pt. Called for lab results 

## 2018-05-04 ENCOUNTER — Ambulatory Visit (INDEPENDENT_AMBULATORY_CARE_PROVIDER_SITE_OTHER): Payer: 59

## 2018-05-04 ENCOUNTER — Ambulatory Visit (HOSPITAL_COMMUNITY)
Admission: EM | Admit: 2018-05-04 | Discharge: 2018-05-04 | Disposition: A | Discharge status: Home / Self Care | Payer: 59 | Attending: Family Medicine | Admitting: Family Medicine

## 2018-05-04 ENCOUNTER — Encounter (HOSPITAL_COMMUNITY): Payer: Self-pay | Admitting: Emergency Medicine

## 2018-05-04 DIAGNOSIS — S62316A Displaced fracture of base of fifth metacarpal bone, right hand, initial encounter for closed fracture: Secondary | ICD-10-CM

## 2018-05-04 MED ORDER — IBUPROFEN 800 MG PO TABS: 800.0000 mg | ORAL_TABLET | Freq: Three times a day (TID) | ORAL | 0 refills | Status: DC

## 2018-05-04 NOTE — Discharge Instructions (Addendum)
Ice  Elevate Ibuprofen for pain See orthopedic in follow up Return for problems

## 2018-05-04 NOTE — ED Notes (Signed)
Pt discharged by provider.

## 2018-05-04 NOTE — ED Provider Notes (Signed)
[facility]    CSN: [id number] Arrival date & time: 05/04/18  1826     History   Chief Complaint Chief Complaint  Patient presents with  . Hand Pain    HPI Jose Malone is a 27 y.o. male.   HPI  This is a healthy 27 year old gentleman. He injured his hand on Saturday 2 days ago.  He states that he was moving heavy dresser and it crushed his hand between the dresser in a wall.  He was immediately painful.  He tried to work today but has had became more swollen and painful.  He is here for evaluation.  No numbness or weakness in the fingers.  He has not tried any medication or ice or elevation. He is in good health with no known allergies.  History reviewed. No pertinent past medical history.  There are no active problems to display for this patient.   Past Surgical History:  Procedure Laterality Date  . WISDOM TOOTH EXTRACTION         Home Medications    Prior to Admission medications   Medication Sig Start Date End Date Taking? Authorizing Provider  ibuprofen (ADVIL,MOTRIN) 800 MG tablet Take 1 tablet (800 mg total) by mouth 3 (three) times daily. 05/04/18   Eustace Moore, MD    Family History No family history on file.  Social History Social History   Tobacco Use  . Smoking status: Never Smoker  Substance Use Topics  . Alcohol use: Yes  . Drug use: No     Allergies   Patient has no known allergies.   Review of Systems Review of Systems  Constitutional: Negative for chills and fever.  HENT: Negative for congestion, dental problem, ear pain and sore throat.   Eyes: Negative for pain and visual disturbance.  Respiratory: Negative for cough and shortness of breath.   Cardiovascular: Negative for chest pain and palpitations.  Gastrointestinal: Negative for abdominal distention and abdominal pain.  Genitourinary: Negative for dysuria and hematuria.  Musculoskeletal: Negative for arthralgias and back pain.       Hand pain  Skin:  Negative for color change and rash.  Neurological: Negative for seizures and syncope.  All other systems reviewed and are negative.    Physical Exam Triage Vital Signs ED Triage Vitals [05/04/18 1854]  Enc Vitals Group     BP (!) 136/91     Pulse Rate 75     Resp 18     Temp 98.7 F (37.1 C)     Temp src      SpO2 100 %     Weight      Height      Head Circumference      Peak Flow      Pain Score      Pain Loc      Pain Edu?      Excl. in GC?    No data found.  Updated Vital Signs BP (!) 136/91   Pulse 75   Temp 98.7 F (37.1 C)   Resp 18   SpO2 100%      Physical Exam   UC Treatments / Results  Labs (all labs ordered are listed, but only abnormal results are displayed) Labs Reviewed - No data to display  EKG None  Radiology Dg Wrist Complete Right  Result Date: 05/04/2018 CLINICAL DATA:  Wrist injury 2 days ago moving furniture. Initial encounter. EXAM: RIGHT WRIST - COMPLETE 3+ VIEW COMPARISON:  None. FINDINGS: Soft  tissue swelling along the medial hand and wrist, associated with a fifth metacarpal base fracture with medial displacement by 2 mm. Tiny bony density at the distal ulna without visible donor site IMPRESSION: 1. Displaced fifth metacarpal base fracture. 2. Suspected small fragment at the distal ulna, donor site not seen. Electronically Signed   By: Marnee Spring M.D.   On: 05/04/2018 19:19   Dg Hand Complete Right  Result Date: 05/04/2018 CLINICAL DATA:  Traumatic right hand pain and swelling. EXAM: RIGHT HAND - COMPLETE 3+ VIEW COMPARISON:  09/26/2010 FINDINGS: Soft tissue swelling along the medial hand and wrist associated with a fifth metacarpal base fracture with medial displacement by 2 mm. Tiny bony density at the distal ulna without visible donor site. No dislocation. IMPRESSION: 1. Displaced fifth metacarpal base fracture. 2. Suspected small avulsion fracture fragment at the distal ulna, donor site not seen. Electronically Signed   By:  Marnee Spring M.D.   On: 05/04/2018 19:20    Procedures Procedures (including critical care time)  Medications Ordered in UC Medications - No data to display  Initial Impression / Assessment and Plan / UC Course  I have reviewed the triage vital signs and the nursing notes.  Pertinent labs & imaging results that were available during my care of the patient were reviewed by me and considered in my medical decision making (see chart for details).     I discussed with the patient his hand fractures.  He took a picture of these fractures for his personal record.  Discussed the importance of ice and elevation to reduce his hand swelling.  He will follow-up with orthopedic.  He knows he needs to call tomorrow.  Ibuprofen is given for pain.  He needs to restrict the use of his hand at work.  Return here as needed Final Clinical Impressions(s) / UC Diagnoses   Final diagnoses:  Closed displaced fracture of base of fifth metacarpal bone of right hand, initial encounter     Discharge Instructions     Ice  Elevate Ibuprofen for pain See orthopedic in follow up Return for problems   ED Prescriptions    Medication Sig Dispense Auth. Provider   ibuprofen (ADVIL,MOTRIN) 800 MG tablet Take 1 tablet (800 mg total) by mouth 3 (three) times daily. 21 tablet Eustace Moore, MD     Controlled Substance Prescriptions [place] Controlled Substance Registry consulted? Not Applicable   Eustace Moore, MD 05/04/18 2019

## 2018-05-04 NOTE — ED Triage Notes (Signed)
Pt states dresser fell on his R hand on Saturday. C/o R hand swelling and pain.

## 2018-05-04 NOTE — Progress Notes (Signed)
Orthopedic Tech Progress Note Patient Details:  Jose Malone 1991/07/12 [id number]  Ortho Devices Type of Ortho Device: Arm sling, Ulna gutter splint Ortho Device/Splint Location: rue Ortho Device/Splint Interventions: Ordered, Application, Adjustment   Post Interventions Patient Tolerated: Well Instructions Provided: Care of device, Adjustment of device   Trinna Post 05/04/2018, 8:01 PM

## 2018-05-05 ENCOUNTER — Ambulatory Visit (INDEPENDENT_AMBULATORY_CARE_PROVIDER_SITE_OTHER): Payer: 59 | Admitting: Orthopaedic Surgery

## 2018-05-05 ENCOUNTER — Encounter (INDEPENDENT_AMBULATORY_CARE_PROVIDER_SITE_OTHER): Payer: Self-pay | Admitting: Orthopaedic Surgery

## 2018-05-05 DIAGNOSIS — S62316A Displaced fracture of base of fifth metacarpal bone, right hand, initial encounter for closed fracture: Secondary | ICD-10-CM

## 2018-05-05 DIAGNOSIS — M79641 Pain in right hand: Secondary | ICD-10-CM | POA: Diagnosis not present

## 2018-05-05 NOTE — Progress Notes (Signed)
Office Visit Note   Patient: Jose Malone           Date of Birth: 1991-11-24           MRN: [id number] Visit Date: 05/05/2018              Requested by: No referring provider defined for this encounter. PCP: Patient, No Pcp Per   Assessment & Plan: Visit Diagnoses:  1. Pain in right hand     Plan: Impression is minimally displaced fracture base of the fifth metacarpal right hand.  We will place the patient in a new removable ulnar gutter splint.  He will elevate for swelling.  He will follow-up with [place] in 2 weeks time for repeat x-rays.  Follow-Up Instructions: Return in about 3 weeks (around 05/26/2018).   Orders:  No orders of the defined types were placed in this encounter.  No orders of the defined types were placed in this encounter.     Procedures: No procedures performed   Clinical Data: No additional findings.   Subjective: Chief Complaint  Patient presents with  . Right Hand - Pain    HPI patient is a pleasant 27 year old gentleman who presents to our clinic today with a new injury to his right hand.  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  He noticed moderate pain but continued to work.  He was seen yesterday at [facility] urgent care where x-rays were obtained.  He was found to have a displaced fracture to the base of fifth metacarpal.  He was placed in an ulnar gutter splint nonweightbearing.  He comes in today for follow-up.  Marked pain and swelling to the right hand.  Worse with motion of the fingers.  No numbness, tingling or burning.  He was given a prescription for ibuprofen but is not been taking this because he does not like to take medication.  Review of Systems as detailed in HPI.  All others reviewed and are negative.   Objective: Vital Signs: There were no vitals taken for this visit.  Physical Exam well-developed and well-nourished gentleman in no acute distress.   Alert and oriented x3.  Ortho Exam examination of his right hand reveals moderate swelling.  Marked diffuse tenderness throughout the fifth metacarpal.  No  rotational deformity.  He is neurovascularly intact distally.  Specialty Comments:  No specialty comments available.  Imaging: Dg Wrist Complete Right  Result Date: 05/04/2018 CLINICAL DATA:  Wrist injury 2 days ago moving furniture. Initial encounter. EXAM: RIGHT WRIST - COMPLETE 3+ VIEW COMPARISON:  None. FINDINGS: Soft tissue swelling along the medial hand and wrist, associated with a fifth metacarpal base fracture with medial displacement by 2 mm. Tiny bony density at the distal ulna without visible donor site IMPRESSION: 1. Displaced fifth metacarpal base fracture. 2. Suspected small fragment at the distal ulna, donor site not seen. Electronically Signed   By: Marnee Spring M.D.   On: 05/04/2018 19:19   Dg Hand Complete Right  Result Date: 05/04/2018 CLINICAL DATA:  Traumatic right hand pain and swelling. EXAM: RIGHT HAND - COMPLETE 3+ VIEW COMPARISON:  09/26/2010 FINDINGS: Soft tissue swelling along the medial hand and wrist associated with a fifth metacarpal base fracture with medial displacement by 2 mm. Tiny bony density at the distal ulna without visible donor site. No dislocation. IMPRESSION: 1. Displaced fifth metacarpal base fracture. 2. Suspected small avulsion fracture fragment at  the distal ulna, donor site not seen. Electronically Signed   By: Marnee Spring M.D.   On: 05/04/2018 19:20     PMFS History: Patient Active Problem List   Diagnosis Date Noted  . Pain in right hand 05/05/2018   History reviewed. No pertinent past medical history.  History reviewed. No pertinent family history.  Past Surgical History:  Procedure Laterality Date  . WISDOM TOOTH EXTRACTION     Social History   Occupational History  . Not on file  Tobacco Use  . Smoking status: Never Smoker  . Smokeless tobacco: Never Used  Substance  and Sexual Activity  . Alcohol use: Yes  . Drug use: No  . Sexual activity: Not on file

## 2018-05-26 ENCOUNTER — Ambulatory Visit (INDEPENDENT_AMBULATORY_CARE_PROVIDER_SITE_OTHER): Payer: 59

## 2018-05-26 ENCOUNTER — Ambulatory Visit (INDEPENDENT_AMBULATORY_CARE_PROVIDER_SITE_OTHER): Payer: 59 | Admitting: Orthopaedic Surgery

## 2018-05-26 ENCOUNTER — Encounter (INDEPENDENT_AMBULATORY_CARE_PROVIDER_SITE_OTHER): Payer: Self-pay | Admitting: Orthopaedic Surgery

## 2018-05-26 DIAGNOSIS — S62316A Displaced fracture of base of fifth metacarpal bone, right hand, initial encounter for closed fracture: Secondary | ICD-10-CM | POA: Insufficient documentation

## 2018-05-26 NOTE — Progress Notes (Signed)
      Patient: Jose Malone           Date of Birth: [birth year]           MRN: [id number] Visit Date: 05/26/2018 PCP: Patient, No Pcp Per   Assessment & Plan:  Chief Complaint:  Chief Complaint  Patient presents with  . Right Hand - Fracture, Follow-up   Visit Diagnoses:  1. Closed displaced fracture of base of fifth metacarpal bone of right hand, initial encounter     Plan: Patient is a pleasant 27 year old gentleman who presents to our clinic today for follow-up of his right hand, displaced fracture base of the fifth metacarpal date of injury 05/02/2018.  He has been wearing a removable ulnar gutter splint and doing well.  Very minimal pain.  No swelling.  Examination of his right hand shows minimal tenderness over the base of the fifth metacarpal.  No swelling.  He is neurovascularly intact distally.  At this point, we will have him start to wean out of his ulnar gutter splint.  He will start gentle range of motion of the hand.  Still no weightbearing.  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  He will follow-up with us in 3 weeks time for repeat evaluation and x-ray.  Follow-Up Instructions: Return in about 3 weeks (around 06/16/2018).   Orders:  Orders Placed This Encounter  Procedures  . XR Hand Complete Right   No orders of the defined types were placed in this encounter.   Imaging: Xr Hand Complete Right  Result Date: 05/26/2018 Stable alignment of the fracture   PMFS History: Patient Active Problem List   Diagnosis Date Noted  . Closed displaced fracture of base of fifth metacarpal bone of right hand 05/26/2018  . Pain in right hand 05/05/2018   History reviewed. No pertinent past medical history.  History reviewed. No pertinent family history.  Past Surgical History:  Procedure Laterality Date  . WISDOM TOOTH EXTRACTION     Social History   Occupational History  . Not on file  Tobacco Use  . Smoking status: Never Smoker  . Smokeless  tobacco: Never Used  Substance and Sexual Activity  . Alcohol use: Yes  . Drug use: No  . Sexual activity: Not on file

## 2018-06-16 ENCOUNTER — Ambulatory Visit (INDEPENDENT_AMBULATORY_CARE_PROVIDER_SITE_OTHER): Payer: 59

## 2018-06-16 ENCOUNTER — Ambulatory Visit (INDEPENDENT_AMBULATORY_CARE_PROVIDER_SITE_OTHER): Payer: 59 | Admitting: Orthopaedic Surgery

## 2018-06-16 DIAGNOSIS — S62316A Displaced fracture of base of fifth metacarpal bone, right hand, initial encounter for closed fracture: Secondary | ICD-10-CM | POA: Diagnosis not present

## 2018-06-16 NOTE — Progress Notes (Signed)
   Post-Op Visit Note   Patient: Jose Malone           Date of Birth: [birth year]           MRN: [id number] Visit Date: 06/16/2018 PCP: Patient, No Pcp Per patient 6 weeks so fifth metacarpal fracture.   Assessment & Plan:  Chief Complaint:  Chief Complaint  Patient presents with  . Right Hand - Follow-up    05/26/18 closed displaced 5th metacarpal fracture   Visit Diagnoses:  1. Closed displaced fracture of base of fifth metacarpal bone of right hand, initial encounter     Plan: Patient is 6 weeks status post nondisplaced fifth metacarpal fracture.  Overall he is doing a lot better.  Has some discomfort with ulnar deviation.  Overall exam is fairly benign.  His x-rays demonstrate stable alignment of the fracture.  He can increase his use of his right hand as his symptoms allow.  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  Questions encouraged and answered.  Follow-up as needed.  Follow-Up Instructions: Return if symptoms worsen or fail to improve.   Orders:  Orders Placed This Encounter  Procedures  . XR Hand Complete Right   No orders of the defined types were placed in this encounter.   Imaging: Xr Hand Complete Right  Result Date: 06/16/2018 Stable alignment fifth metacarpal fracture.  There is evidence of callus formation.   PMFS History: Patient Active Problem List   Diagnosis Date Noted  . Closed displaced fracture of base of fifth metacarpal bone of right hand 05/26/2018  . Pain in right hand 05/05/2018   No past medical history on file.  No family history on file.  Past Surgical History:  Procedure Laterality Date  . WISDOM TOOTH EXTRACTION     Social History   Occupational History  . Not on file  Tobacco Use  . Smoking status: Never Smoker  . Smokeless tobacco: Never Used  Substance and Sexual Activity  . Alcohol use: Yes  . Drug use: No  . Sexual activity: Not on file

## 2018-06-23 IMAGING — DX DG HAND COMPLETE 3+V*R*
3 series · 3 of 3 positions shown · non-contrast
Comparison: 09/26/2010

CLINICAL DATA: Traumatic right hand pain and swelling.

EXAM:
RIGHT HAND - COMPLETE 3+ VIEW

[hand pa]
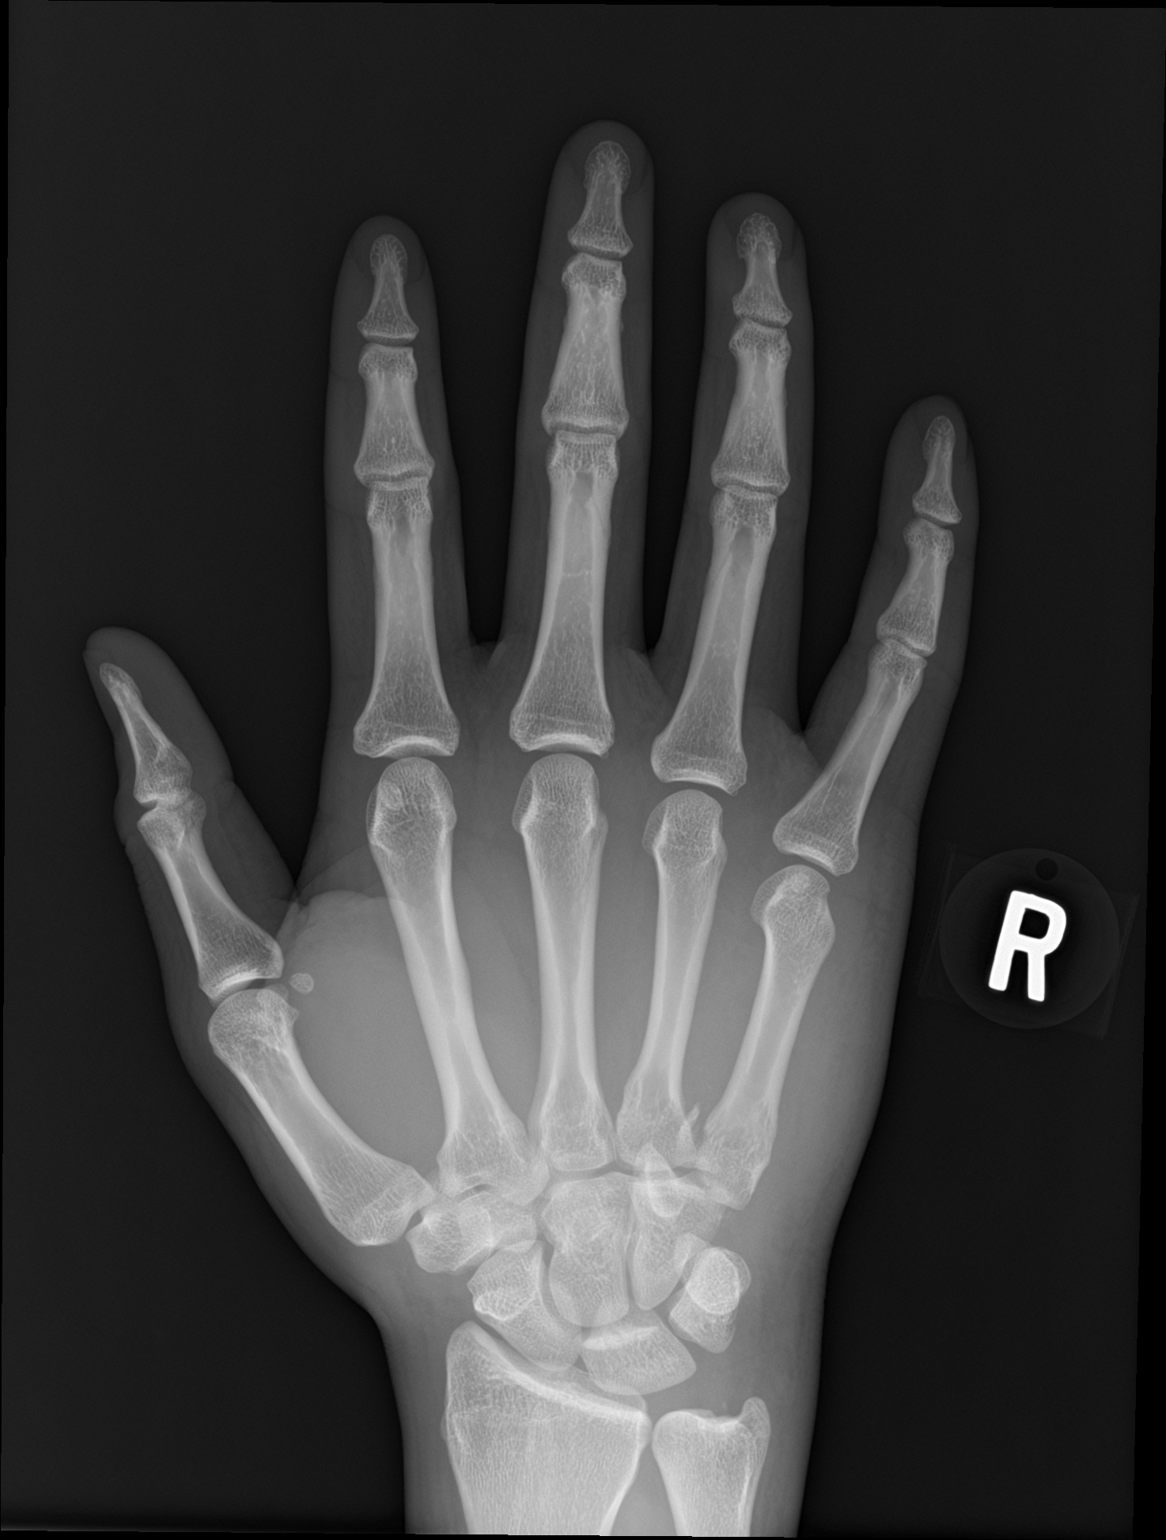

[hand obl]
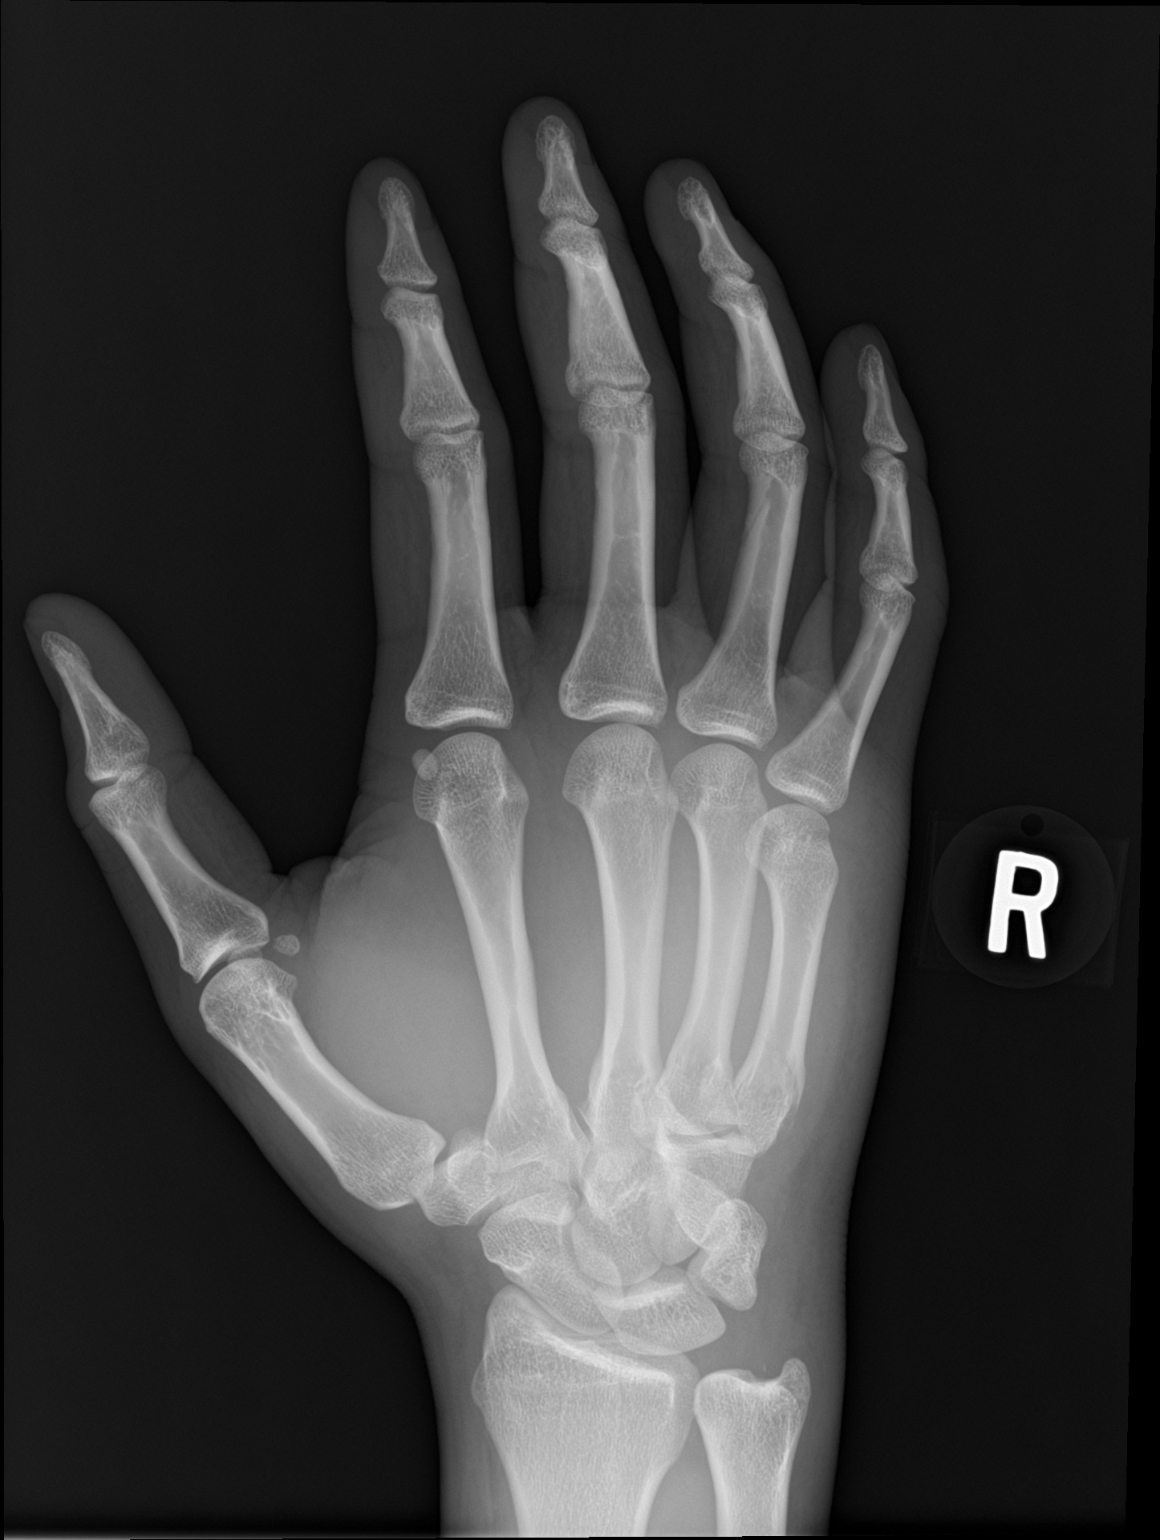

[hand lat]
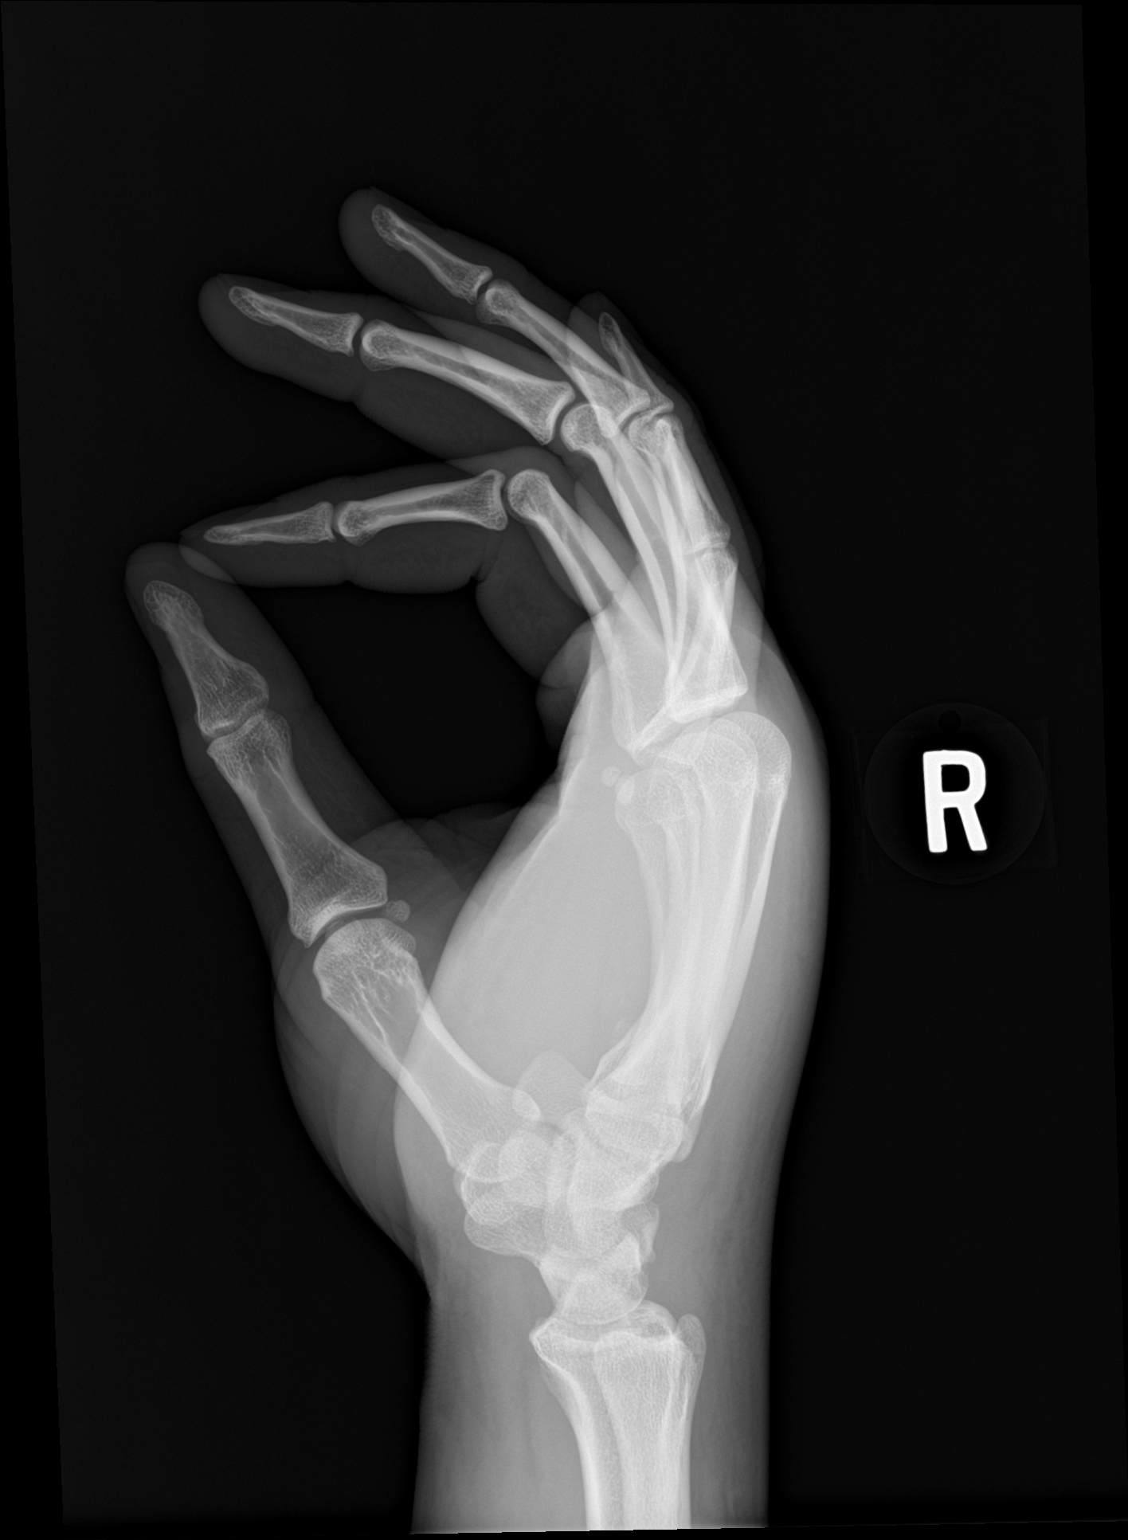

[3 of 3 positions shown; findings below may reference images not displayed]

FINDINGS: Soft tissue swelling along the medial hand and wrist associated with
a fifth metacarpal base fracture with medial displacement by 2 mm.

Tiny bony density at the distal ulna without visible donor site.

No dislocation.
IMPRESSION: 1. Displaced fifth metacarpal base fracture.
2. Suspected small avulsion fracture fragment at the distal ulna,
donor site not seen.

## 2021-09-14 ENCOUNTER — Other Ambulatory Visit: Payer: Self-pay

## 2021-09-14 ENCOUNTER — Emergency Department (HOSPITAL_BASED_OUTPATIENT_CLINIC_OR_DEPARTMENT_OTHER)
Admission: EM | Admit: 2021-09-14 | Discharge: 2021-09-14 | Disposition: A | Discharge status: Home / Self Care | Payer: No Typology Code available for payment source | Attending: Emergency Medicine | Admitting: Emergency Medicine

## 2021-09-14 ENCOUNTER — Encounter (HOSPITAL_BASED_OUTPATIENT_CLINIC_OR_DEPARTMENT_OTHER): Payer: Self-pay | Admitting: Emergency Medicine

## 2021-09-14 DIAGNOSIS — Z20822 Contact with and (suspected) exposure to covid-19: Secondary | ICD-10-CM | POA: Diagnosis not present

## 2021-09-14 DIAGNOSIS — R059 Cough, unspecified: Secondary | ICD-10-CM | POA: Diagnosis present

## 2021-09-14 DIAGNOSIS — J40 Bronchitis, not specified as acute or chronic: Secondary | ICD-10-CM | POA: Insufficient documentation

## 2021-09-14 LAB — RESP PANEL BY RT-PCR (FLU A&B, COVID) ARPGX2
Influenza A by PCR: NEGATIVE
Influenza B by PCR: NEGATIVE
SARS Coronavirus 2 by RT PCR: NEGATIVE

## 2021-09-14 MED ORDER — PREDNISONE 20 MG PO TABS: 40.0000 mg | ORAL_TABLET | Freq: Every day | ORAL | 0 refills | Status: DC

## 2021-09-14 MED ORDER — PREDNISONE 50 MG PO TABS
60.0000 mg | ORAL_TABLET | Freq: Once | ORAL | Status: AC
  Administered 2021-09-14: 60 mg via ORAL
  Filled 2021-09-14: qty 1

## 2021-09-14 MED ORDER — ALBUTEROL SULFATE HFA 108 (90 BASE) MCG/ACT IN AERS
2.0000 | INHALATION_SPRAY | RESPIRATORY_TRACT | Status: DC | PRN
  Filled 2021-09-14: qty 6.7

## 2021-09-14 NOTE — ED Provider Notes (Signed)
[facility] EMERGENCY DEPT Provider Note   CSN: [id number] Arrival date & time: 09/14/21  0230     History Chief Complaint  Patient presents with   Cough    Jose Malone is a 30 y.o. male.  Patient presents to the emergency department for evaluation of cough and shortness of breath.  Symptoms ongoing for a week.  Patient reports that he had asthma when he was a kid and as an adult has had recurrent bronchitis similar to this.      History reviewed. No pertinent past medical history.  Patient Active Problem List   Diagnosis Date Noted   Closed displaced fracture of base of fifth metacarpal bone of right hand 05/26/2018   Pain in right hand 05/05/2018    Past Surgical History:  Procedure Laterality Date   WISDOM TOOTH EXTRACTION         No family history on file.  Social History   Tobacco Use   Smoking status: Never   Smokeless tobacco: Never  Substance Use Topics   Alcohol use: Yes   Drug use: No    Home Medications Prior to Admission medications   Medication Sig Start Date End Date Taking? Authorizing Provider  ibuprofen (ADVIL,MOTRIN) 800 MG tablet Take 1 tablet (800 mg total) by mouth 3 (three) times daily. Patient not taking: Reported on 05/26/2018 05/04/18   Eustace Moore, MD  predniSONE (DELTASONE) 20 MG tablet Take 2 tablets (40 mg total) by mouth daily with breakfast. 09/14/21   Labaron Digirolamo, Canary Brim, MD    Allergies    Patient has no known allergies.  Review of Systems   Review of Systems  Respiratory:  Positive for cough, shortness of breath and wheezing.   All other systems reviewed and are negative.  Physical Exam Updated Vital Signs BP (!) 137/92   Pulse 81   Temp 98.4 F (36.9 C) (Oral)   Resp 18   Ht 6\' 3"  (1.905 m)   Wt (!) 139 kg   SpO2 97%   BMI 38.30 kg/m   Physical Exam Vitals and nursing note reviewed.  Constitutional:      General: He is not in acute distress.    Appearance: Normal appearance. He is  well-developed.  HENT:     Head: Normocephalic and atraumatic.     Right Ear: Hearing normal.     Left Ear: Hearing normal.     Nose: Nose normal.  Eyes:     Conjunctiva/sclera: Conjunctivae normal.     Pupils: Pupils are equal, round, and reactive to light.  Cardiovascular:     Rate and Rhythm: Regular rhythm.     Heart sounds: S1 normal and S2 normal. No murmur heard.   No friction rub. No gallop.  Pulmonary:     Effort: Pulmonary effort is normal. No respiratory distress.     Breath sounds: No decreased air movement. Wheezing present. No decreased breath sounds.  Chest:     Chest wall: No tenderness.  Abdominal:     General: Bowel sounds are normal.     Palpations: Abdomen is soft.     Tenderness: There is no abdominal tenderness. There is no guarding or rebound. Negative signs include Murphy's sign and McBurney's sign.     Hernia: No hernia is present.  Musculoskeletal:        General: Normal range of motion.     Cervical back: Normal range of motion and neck supple.  Skin:    General: Skin is warm and  dry.     Findings: No rash.  Neurological:     Mental Status: He is alert and oriented to person, place, and time.     GCS: GCS eye subscore is 4. GCS verbal subscore is 5. GCS motor subscore is 6.     Cranial Nerves: No cranial nerve deficit.     Sensory: No sensory deficit.     Coordination: Coordination normal.  Psychiatric:        Speech: Speech normal.        Behavior: Behavior normal.        Thought Content: Thought content normal.    ED Results / Procedures / Treatments   Labs (all labs ordered are listed, but only abnormal results are displayed) Labs Reviewed  RESP PANEL BY RT-PCR (FLU A&B, COVID) [id number]    EKG None  Radiology No results found.  Procedures Procedures   Medications Ordered in ED Medications  albuterol (VENTOLIN HFA) 108 (90 Base) MCG/ACT inhaler 2 puff (has no administration in time range)  predniSONE (DELTASONE) tablet 60 mg (has  no administration in time range)    ED Course  I have reviewed the triage vital signs and the nursing notes.  Pertinent labs & imaging results that were available during my care of the patient were reviewed by me and considered in my medical decision making (see chart for details).    MDM Rules/Calculators/A&P                           Patient presents with cough and shortness of breath.  Patient has good air movement and normal oxygen saturations.  There is wheezing present.  This is consistent with viral URI and bronchitis with bronchospasm.  Patient treated with albuterol inhaler, will add prednisone.  He appears well, no further work-up necessary.  Final Clinical Impression(s) / ED Diagnoses Final diagnoses:  Bronchitis    Rx / DC Orders ED Discharge Orders          Ordered    predniSONE (DELTASONE) 20 MG tablet  Daily with breakfast,   Status:  Discontinued        09/14/21 0353    predniSONE (DELTASONE) 20 MG tablet  Daily with breakfast        09/14/21 0353             Gilda Crease, MD 09/14/21 [phone]

## 2021-09-14 NOTE — ED Notes (Signed)
RT educated pt on proper use of MDI w/spacer. Pt able to perform w/out difficulty. Pt respiratory status stable at this time w/no distress noted. Pt able to teach back process to this RT. RT will continue to monitor.

## 2021-09-14 NOTE — ED Triage Notes (Signed)
Cough x 1 week. Thinks he has bronchitis.

## 2024-06-02 ENCOUNTER — Ambulatory Visit (HOSPITAL_BASED_OUTPATIENT_CLINIC_OR_DEPARTMENT_OTHER): Admitting: Family Medicine

## 2024-06-02 ENCOUNTER — Encounter (HOSPITAL_BASED_OUTPATIENT_CLINIC_OR_DEPARTMENT_OTHER): Payer: Self-pay | Admitting: Family Medicine

## 2024-06-02 VITALS — BP 136/90 | HR 88 | Ht 75.0 in | Wt 310.0 lb

## 2024-06-02 DIAGNOSIS — L729 Follicular cyst of the skin and subcutaneous tissue, unspecified: Secondary | ICD-10-CM | POA: Insufficient documentation

## 2024-06-02 DIAGNOSIS — N529 Male erectile dysfunction, unspecified: Secondary | ICD-10-CM | POA: Insufficient documentation

## 2024-06-02 DIAGNOSIS — R030 Elevated blood-pressure reading, without diagnosis of hypertension: Secondary | ICD-10-CM | POA: Diagnosis not present

## 2024-06-02 DIAGNOSIS — R590 Localized enlarged lymph nodes: Secondary | ICD-10-CM | POA: Insufficient documentation

## 2024-06-02 NOTE — Progress Notes (Signed)
 New Patient Office Visit  Subjective:   Jose Malone 03/29/1991 06/02/2024  Chief Complaint  Patient presents with   New Patient (Initial Visit)    Patient is here today to get established with the practice. Denies any main concerns for today's visit.    HPI: Jose Malone presents today to establish care at [facility] at [facility]. Introduced to Publishing rights manager role and practice setting.  All questions answered.   ERECTILE DYSFUNCTION:  Patient states he has been having difficulty getting and maintaining an erection. States this started in 2018 and improved but has returned. He denies hx of diabetes, HTN. Denies issues with urination, numbness, tenderness or injuries to groin. Denies excess alcohol use. Denies tobacco use.   SCALP NODULE:  Patient states he has a knot present to the anterior head present for about 1 year and a knot to right posterior ear for a long time. Denies pain, drainage. He has noticed some enlargement to the postauricular area for the past year that waxes and wanes. He has noticed mild hair loss to the one on his scalp.   The following portions of the patient's history were reviewed and updated as appropriate: past medical history, past surgical history, family history, social history, allergies, medications, and problem list.   Patient Active Problem List   Diagnosis Date Noted   Scalp cyst 06/02/2024   Erectile dysfunction 06/02/2024   Lymphadenopathy, postauricular 06/02/2024   Elevated BP without diagnosis of hypertension 06/02/2024   Closed displaced fracture of base of fifth metacarpal bone of right hand 05/26/2018   Pain in right hand 05/05/2018   History reviewed. No pertinent past medical history. Past Surgical History:  Procedure Laterality Date   WISDOM TOOTH EXTRACTION     History reviewed. No pertinent family history. Social History   Socioeconomic History   Marital status: Single     Spouse name: Not on file   Number of children: Not on file   Years of education: Not on file   Highest education level: Not on file  Occupational History   Not on file  Tobacco Use   Smoking status: Never   Smokeless tobacco: Never  Vaping Use   Vaping status: Never Used  Substance and Sexual Activity   Alcohol use: Yes    Comment: drinks 5-6 shots/mixed drinks about 2-3 times a month   Drug use: No   Sexual activity: Not on file  Other Topics Concern   Not on file  Social History Narrative   ** Merged History Encounter **       Social Drivers of Corporate investment banker Strain: Not on file  Food Insecurity: Not on file  Transportation Needs: Not on file  Physical Activity: Not on file  Stress: Not on file  Social Connections: Unknown (04/28/2022)   Received from [facility]   Social Network    Social Network: Not on file  Intimate Partner Violence: Unknown (03/26/2022)   Received from [facility]   HITS    Physically Hurt: Not on file    Insult or Talk Down To: Not on file    Threaten Physical Harm: Not on file    Scream or Curse: Not on file   Outpatient Medications Prior to Visit  Medication Sig Dispense Refill   ibuprofen  (ADVIL ,MOTRIN ) 800 MG tablet Take 1 tablet (800 mg total) by mouth 3 (three) times daily. (Patient not taking: Reported on 05/26/2018) 21 tablet 0   predniSONE  (  DELTASONE ) 20 MG tablet Take 2 tablets (40 mg total) by mouth daily with breakfast. 10 tablet 0   No facility-administered medications prior to visit.   No Known Allergies  ROS: A complete ROS was performed with pertinent positives/negatives noted in the HPI. The remainder of the ROS are negative.   Objective:   Today's Vitals   06/02/24 1404 06/02/24 1420  BP: (!) 131/92 (!) 136/90  Pulse: 88   SpO2: 96%   Weight: (!) 310 lb (140.6 kg)   Height: 6' 3 (1.905 m)     GENERAL: Well-appearing, in NAD. Well nourished.  SKIN: Pink, warm and dry. Small mobile nodule present to  anterior frontal scalp without drainage or erythema.  Head: Normocephalic. NECK: Trachea midline. Full ROM w/o pain or tenderness. Right sided post auricular mobile nodule, likely lymphadenopathy.  EARS: Tympanic membranes are intact, translucent without bulging and without drainage. Appropriate landmarks visualized.  EYES: Conjunctiva clear without exudates. EOMI, PERRL, no drainage present.  NOSE: Septum midline w/o deformity. Nares patent, mucosa pink and non-inflamed w/o drainage. No sinus tenderness.  RESPIRATORY: Chest wall symmetrical. Respirations even and non-labored.  MSK: Muscle tone and strength appropriate for age.  NEUROLOGIC: No motor or sensory deficits. Steady, even gait. C2-C12 intact.  PSYCH/MENTAL STATUS: Alert, oriented x 3. Cooperative, appropriate mood and affect.     Assessment & Plan:  1. Lymphadenopathy, postauricular (Primary) Possible benign lymphadenopathy versus nodule or mass.  Will obtain ultrasound soft tissue head and neck to rule out malignancy. - US  SOFT TISSUE HEAD & NECK (NON-THYROID); Future  2. Erectile dysfunction, unspecified erectile dysfunction type Discussed multifactorial causes of erectile dysfunction with patient.  Will obtain lab work to rule out possible thyroid disorders, testosterone, prediabetes or diabetes with lab work and electrolyte abnormalities.  Pending lab results, discussed plan of care possibly including urology consult.  Patient verbalized understanding - CBC with Differential/Platelet; Future - Comprehensive metabolic panel with GFR; Future - Lipid panel; Future - TSH; Future - Testosterone,Free and Total; Future - Hemoglobin A1c; Future  3. Scalp cyst Referral placed to dermatology to discuss evaluation and possible excision options.  No drainage or erythema present to the area. - Ambulatory referral to Dermatology  4. Elevated BP without diagnosis of hypertension Discussed elevated blood pressure and recommend monitoring  BP, lowering salt intake, regular exercise and heart healthy diet.  Will repeat with patient's upcoming visit for annual exam and consider antihypertensive pending blood pressure.  No hypertension or borderline hypertension present in previous visits per chart review.   Patient to reach out to office if new, worrisome, or unresolved symptoms arise or if no improvement in patient's condition. Patient verbalized understanding and is agreeable to treatment plan. All questions answered to patient's satisfaction.    Return in about 3 months (around 09/02/2024) for ANNUAL PHYSICAL, BP elevated.    Nonda Bays, [place]

## 2024-06-30 ENCOUNTER — Ambulatory Visit (HOSPITAL_BASED_OUTPATIENT_CLINIC_OR_DEPARTMENT_OTHER)
Admission: RE | Admit: 2024-06-30 | Discharge: 2024-06-30 | Disposition: A | Discharge status: Home / Self Care | Source: Ambulatory Visit | Attending: Family Medicine | Admitting: Family Medicine

## 2024-06-30 ENCOUNTER — Ambulatory Visit (HOSPITAL_BASED_OUTPATIENT_CLINIC_OR_DEPARTMENT_OTHER): Payer: Self-pay | Admitting: Family Medicine

## 2024-06-30 ENCOUNTER — Other Ambulatory Visit (HOSPITAL_BASED_OUTPATIENT_CLINIC_OR_DEPARTMENT_OTHER): Payer: Self-pay | Admitting: *Deleted

## 2024-06-30 DIAGNOSIS — N529 Male erectile dysfunction, unspecified: Secondary | ICD-10-CM

## 2024-06-30 DIAGNOSIS — R590 Localized enlarged lymph nodes: Secondary | ICD-10-CM | POA: Diagnosis present

## 2024-06-30 NOTE — Progress Notes (Signed)
 Your ultrasound shows a benign (non cancerous) lymph node present. No further workup or evaluation is needed.

## 2024-07-01 LAB — HEMOGLOBIN A1C
Est. average glucose Bld gHb Est-mCnc: 128 mg/dL
Hgb A1c MFr Bld: 6.1 % — ABNORMAL HIGH (ref 4.8–5.6)

## 2024-07-01 LAB — TESTOSTERONE,FREE AND TOTAL
Testosterone, Free: 11.4 pg/mL (ref 8.7–25.1)
Testosterone: 270 ng/dL (ref 264–916)

## 2024-07-01 LAB — CBC WITH DIFFERENTIAL/PLATELET
Basophils Absolute: 0 x10E3/uL (ref 0.0–0.2)
Basos: 0 %
EOS (ABSOLUTE): 0.2 x10E3/uL (ref 0.0–0.4)
Eos: 3 %
Hematocrit: 46.9 % (ref 37.5–51.0)
Hemoglobin: 15.2 g/dL (ref 13.0–17.7)
Immature Grans (Abs): 0 x10E3/uL (ref 0.0–0.1)
Immature Granulocytes: 0 %
Lymphocytes Absolute: 2.2 x10E3/uL (ref 0.7–3.1)
Lymphs: 40 %
MCH: 28 pg (ref 26.6–33.0)
MCHC: 32.4 g/dL (ref 31.5–35.7)
MCV: 86 fL (ref 79–97)
Monocytes Absolute: 0.5 x10E3/uL (ref 0.1–0.9)
Monocytes: 9 %
Neutrophils Absolute: 2.6 x10E3/uL (ref 1.4–7.0)
Neutrophils: 48 %
Platelets: 329 x10E3/uL (ref 150–450)
RBC: 5.43 x10E6/uL (ref 4.14–5.80)
RDW: 14.6 % (ref 11.6–15.4)
WBC: 5.4 x10E3/uL (ref 3.4–10.8)

## 2024-07-01 LAB — COMPREHENSIVE METABOLIC PANEL WITH GFR
ALT: 33 IU/L (ref 0–44)
AST: 26 IU/L (ref 0–40)
Albumin: 4.4 g/dL (ref 4.1–5.1)
Alkaline Phosphatase: 80 IU/L (ref 44–121)
BUN/Creatinine Ratio: 7 — ABNORMAL LOW (ref 9–20)
BUN: 8 mg/dL (ref 6–20)
Bilirubin Total: 0.4 mg/dL (ref 0.0–1.2)
CO2: 22 mmol/L (ref 20–29)
Calcium: 9.8 mg/dL (ref 8.7–10.2)
Chloride: 101 mmol/L (ref 96–106)
Creatinine, Ser: 1.08 mg/dL (ref 0.76–1.27)
Globulin, Total: 2.7 g/dL (ref 1.5–4.5)
Glucose: 81 mg/dL (ref 70–99)
Potassium: 4.9 mmol/L (ref 3.5–5.2)
Sodium: 141 mmol/L (ref 134–144)
Total Protein: 7.1 g/dL (ref 6.0–8.5)
eGFR: 94 mL/min/1.73 (ref >59)

## 2024-07-01 LAB — LIPID PANEL
Chol/HDL Ratio: 4.9 ratio (ref 0.0–5.0)
Cholesterol, Total: 195 mg/dL (ref 100–199)
HDL: 40 mg/dL (ref >39)
LDL Chol Calc (NIH): 129 mg/dL — ABNORMAL HIGH (ref 0–99)
Triglycerides: 147 mg/dL (ref 0–149)
VLDL Cholesterol Cal: 26 mg/dL (ref 5–40)

## 2024-07-01 LAB — TSH: TSH: 1.24 u[IU]/mL (ref 0.450–4.500)

## 2024-07-02 NOTE — Telephone Encounter (Signed)
 Please see message sent by pt about results.

## 2024-07-02 NOTE — Progress Notes (Signed)
 Hi Ever Your A1c is in the range of prediabetes.  I would recommend medication such as metformin  to help lower your risk of progression to type 2 diabetes.  Please let me know if you would start medication.  Your LDL was slightly elevated, decreasing fatty foods and regular exercise can help with this.  Your electrolytes, kidney and liver function were stable.  Thyroid  function is stable and blood counts are normal.  Your testosterone  was on the lower end of normal.  If you are interested in testosterone  replacement therapy, please let us  know and we will make a referral to urology.

## 2024-07-05 ENCOUNTER — Other Ambulatory Visit (HOSPITAL_BASED_OUTPATIENT_CLINIC_OR_DEPARTMENT_OTHER): Payer: Self-pay | Admitting: Family Medicine

## 2024-07-05 DIAGNOSIS — R7303 Prediabetes: Secondary | ICD-10-CM | POA: Insufficient documentation

## 2024-07-05 MED ORDER — METFORMIN HCL ER 500 MG PO TB24
500.0000 mg | ORAL_TABLET | Freq: Every day | ORAL | 3 refills | Status: AC
Start: 2024-07-05 — End: ?

## 2024-07-07 ENCOUNTER — Encounter (INDEPENDENT_AMBULATORY_CARE_PROVIDER_SITE_OTHER): Payer: Self-pay | Admitting: Internal Medicine

## 2024-07-07 ENCOUNTER — Ambulatory Visit (INDEPENDENT_AMBULATORY_CARE_PROVIDER_SITE_OTHER): Payer: Self-pay | Admitting: Internal Medicine

## 2024-07-07 VITALS — BP 138/78 | HR 79 | Temp 98.4°F | Ht 74.0 in | Wt 307.0 lb

## 2024-07-07 DIAGNOSIS — R7303 Prediabetes: Secondary | ICD-10-CM

## 2024-07-07 DIAGNOSIS — G8929 Other chronic pain: Secondary | ICD-10-CM

## 2024-07-07 DIAGNOSIS — E785 Hyperlipidemia, unspecified: Secondary | ICD-10-CM

## 2024-07-07 DIAGNOSIS — M5442 Lumbago with sciatica, left side: Secondary | ICD-10-CM | POA: Diagnosis not present

## 2024-07-07 DIAGNOSIS — Z0289 Encounter for other administrative examinations: Secondary | ICD-10-CM

## 2024-07-07 DIAGNOSIS — M5441 Lumbago with sciatica, right side: Secondary | ICD-10-CM

## 2024-07-07 DIAGNOSIS — R030 Elevated blood-pressure reading, without diagnosis of hypertension: Secondary | ICD-10-CM

## 2024-07-07 DIAGNOSIS — E66812 Obesity, class 2: Secondary | ICD-10-CM

## 2024-07-07 DIAGNOSIS — Z6839 Body mass index (BMI) 39.0-39.9, adult: Secondary | ICD-10-CM

## 2024-07-07 NOTE — Progress Notes (Signed)
 [address], [place], [place] [address] Office: [phone]  /  Fax: [phone]   Initial Consultation    Jose Malone was seen in clinic today to evaluate for obesity. He is interested in losing weight to improve overall health and reduce the risk of weight related complications. He presents today to review program treatment options, initial physical assessment, and evaluation.    Anthropometrics and Bioimpedance Analysis   Body mass index is 39.42 kg/m. Body Fat Mass : 98.6 % Visceral Fat Mass Rating : 16   Obesity Related Diseases and Complications  Obesity Quality of Life and Psychosocial Complications: Reduced health-related quality of life  Cardiometabolic: Prediabetes and/or insulin resistance, Dyslipidemia or hypercholesterolemia, DOE, and Fatigue  Biomechanical: Low back pain   Weight Related History  He was referred by: Friend or Family  When asked what they would like to accomplish? He states: Adopt a healthier eating pattern and lifestyle, Improve energy levels and physical activity, Improve existing medical conditions, and Improve quality of life  Weight history: He had no issues with weight until age 69. He noticed he had increased appetite. He has not tried any specific diets.  Has noted less activity since age 40 due to back pain.  Highest weight: 312 lbs  Contributing factors: family history of obesity, consumption of processed foods, reduced physical activity, and chronic skipping of meals  Prior weight loss attempts: None  Current or previous pharmacotherapy: Is interested in pharmacotherapy  Response to medication: Never tried medications  Current nutrition plan: None  Greatest challenge with dieting: increased appetite and low energy.  Current level of physical activity: None  Barriers to Exercise: orthopedic problems- low back pain x 3 years- no injury, possible sciatica  Readiness and Motivation  On a scale from 0 to 10 How ready  are you to make changes to your eating and physical activity to lose weight? 10 How important is it for you to lose weight right now ? 10 How confident are you that you can lose weight if you try? 9  Past Medical History  History reviewed. No pertinent past medical history.   Objective    BP 138/78   Pulse 79   Temp 98.4 F (36.9 C)   Ht 6' 2 (1.88 m)   Wt (!) 307 lb (139.3 kg)   SpO2 97%   BMI 39.42 kg/m  He was weighed on the bioimpedance scale: Body mass index is 39.42 kg/m.    General:  Alert, oriented and cooperative. Patient is in no acute distress.  Respiratory: Normal respiratory effort, no problems with respiration noted   Gait: able to ambulate independently  Mental Status: Normal mood and affect. Normal behavior. Normal judgment and thought content.   Diagnostic Data Reviewed  BMET    Component Value Date/Time   NA 141 06/30/2024 0934   K 4.9 06/30/2024 0934   CL 101 06/30/2024 0934   CO2 22 06/30/2024 0934   GLUCOSE 81 06/30/2024 0934   GLUCOSE 81 05/21/2016 1258   BUN 8 06/30/2024 0934   CREATININE 1.08 06/30/2024 0934   CREATININE 1.18 05/21/2016 1258   CALCIUM 9.8 06/30/2024 0934   Lab Results  Component Value Date   HGBA1C 6.1 (H) 06/30/2024   No results found for: INSULIN CBC    Component Value Date/Time   WBC 5.4 06/30/2024 0934   RBC 5.43 06/30/2024 0934   HGB 15.2 06/30/2024 0934   HCT 46.9 06/30/2024 0934   PLT 329 06/30/2024 0934   MCV 86  06/30/2024 0934   MCH 28.0 06/30/2024 0934   MCHC 32.4 06/30/2024 0934   RDW 14.6 06/30/2024 0934   Iron/TIBC/Ferritin/ %Sat No results found for: IRON, TIBC, FERRITIN, IRONPCTSAT Lipid Panel     Component Value Date/Time   CHOL 195 06/30/2024 0934   TRIG 147 06/30/2024 0934   HDL 40 06/30/2024 0934   CHOLHDL 4.9 06/30/2024 0934   LDLCALC 129 (H) 06/30/2024 0934   Hepatic Function Panel     Component Value Date/Time   PROT 7.1 06/30/2024 0934   ALBUMIN 4.4 06/30/2024 0934    AST 26 06/30/2024 0934   ALT 33 06/30/2024 0934   ALKPHOS 80 06/30/2024 0934   BILITOT 0.4 06/30/2024 0934      Component Value Date/Time   TSH 1.240 06/30/2024 0934    Medications  Outpatient Encounter Medications as of 07/07/2024  Medication Sig   metFORMIN  (GLUCOPHAGE -XR) 500 MG 24 hr tablet Take 1 tablet (500 mg total) by mouth daily with breakfast.   No facility-administered encounter medications on file as of 07/07/2024.     Assessment and Plan   Prediabetes Lab Results  Component Value Date   HGBA1C 6.1 (H) 06/30/2024    Most recent A1c is 6.1 Pt is aware Patient is  aware of disease state and risk of progression. This may contribute to abnormal cravings, fatigue and diabetic complications without having diabetes.   He is currently on metformin  XR 500 mg once a day for pharmacoprophylaxis and will continue medication.  Discussed loss of 5-10% of body weight can improve levels Will be screened with a fasting glucose, insulin and A1c at next visit  Dyslipidemia Lab Results  Component Value Date   CHOL 195 06/30/2024   HDL 40 06/30/2024   LDLCALC 129 (H) 06/30/2024   TRIG 147 06/30/2024   CHOLHDL 4.9 06/30/2024    Most recent LDL was not at goal at 129  Elevated LDL may be related to excess adiposity or genetics.  Recommend weight loss of 10% to help lower LDL cholesterol and decreased cardiovascular risks. Focus on decreasing saturated fats.    Chronic bilateral low back pain with bilateral sciatica  Will focus on weight loss of 10% to help decrease symptoms related to increased adiposity. Encouraged to increase stretching and activity as tolerated to help improve symptoms    Elevated BP without diagnosis of hypertension BP Readings from Last 3 Encounters:  07/07/24 138/78  06/02/24 (!) 136/90  09/14/21 130/87    Focus on losing 10% of weight to decrease cardiac risk factors Limit salt intake and simple carbohydrates     Obesity Treatment and Action  Plan:  Class 2 severe obesity with serious comorbidity and body mass index (BMI) of 39.0 to 39.9 in adult, unspecified obesity type (HCC)  We reviewed anthropometrics, biometrics, associated medical conditions and contributing factors with patient. he would benefit from a medically tailored reduced calorie nutrional plan based on his REE (resting energy expenditure), which will be determined by indirect calorimetry.  We will also assess for cardiometabolic risk and nutritional derangements via fasting labs at intake appointment.    Patient will work on garnering support from family and friends to begin weight loss journey. Will work on eliminating or reducing the presence of highly palatable, calorie dense foods in the home. Will complete provided nutritional and psychosocial assessment questionnaire before the next appointment. Will be scheduled for indirect calorimetry to determine resting energy expenditure in a fasting state.  This will allow us  to create a reduced  calorie, high-protein meal plan to promote loss of fat mass while preserving muscle mass. Counseled on the health benefits of losing 5%-15% of total body weight. Was counseled on nutritional approaches to weight loss and benefits of reducing processed foods and consuming plant-based foods and high quality protein as part of nutritional weight management. Was counseled on pharmacotherapy and role as an adjunct in weight management.   Education and Additional resources  He was weighed on the bioimpedance scale and results were discussed and documented in the synopsis.  We discussed obesity as a progressive, chronic disease and the importance of a more detailed evaluation of all the factors contributing to the disease.  We reviewed the basic principles in obesity management.   We discussed the importance of long term lifestyle changes which include nutrition, exercise and behavioral modification as well as the importance of  customizing this to his specific health and social needs.  We reviewed the role of medical interventions including pharmacotherapy and surgical interventions.   We discussed the benefits of reaching a healthier weight to alleviate the symptoms of existing conditions and reduce the risks of the biomechanical, cardiometabolic and psychological effects of obesity.  We reviewed our program approach and philosophy, which are guided by the four pillars of obesity medicine.  We discussed how to prepare for intake appointment and the importance of fasting and avoidance of stimulants for at least 8 hours prior to indirect calorimetry.  Jose Malone appears to be in the action stage of change and reports being ready to initiate intensive lifestyle and behavioral modifications as part of their weight loss journey.  Attestation  Reviewed by clinician on day of visit: allergies, medications, problem list, medical history, surgical history, family history, social history, and previous encounter notes pertinent to obesity diagnosis.  I have spent 48 minutes in the care of the patient today including: 3 minutes before the visit reviewing and preparing the chart. 35 minutes face-to-face assessing and reviewing listed medical problems as outlined in obesity care plan, providing nutritional and behavioral counseling on topics outlined in the obesity care plan, independently interpreting test results and goals of care, as described in assessment and plan, and reviewing and discussing biometric information and progress 10 minutes after the visit updating chart and documentation of encounter.   Lucas Parker, MD, ABIM, [place]

## 2024-07-07 NOTE — Progress Notes (Deleted)
 [address] [place], [place], [place] [address] Office: [phone]  /  Fax: [phone]   Initial Consultation    Jose Malone was seen in clinic today to evaluate for obesity. He is interested in losing weight to improve overall health and reduce the risk of weight related complications. He presents today to review program treatment options, initial physical assessment, and evaluation.    Anthropometrics and Bioimpedance Analysis   Body mass index is 39.42 kg/m. Body Fat Mass : *** % Visceral Fat Mass Rating : *** Waist to Height Ratio: ***  Obesity Related Diseases and Complications  Obesity Quality of Life and Psychosocial Complications: {emqolpsychosoc:33006::Reduced health-related quality of life}  Cardiometabolic: {emcardiometabolic complications:33007}  Biomechanical: {embiomechanical:33008}   Weight Related History  He was referred by: {emreferby:28303}  When asked what they would like to accomplish? He states: {EMHopetoaccomplish:28304::Adopt a healthier eating pattern and lifestyle,Improve energy levels and physical activity,Improve existing medical conditions,Improve quality of life}  Weight history: ***  Highest weight: ***  Contributing factors: {EMcontributingfactors:28307}  Prior weight loss attempts: {emweightlossprograms:31590::None}  Current or previous pharmacotherapy: {EM previousRx:28311}  Response to medication: {EMResponsetomedication:28312}  Current nutrition plan: {EMNutritionplan:28309::None}  Greatest challenge with dieting: {emgreatestchallengediet:31593}.  Current level of physical activity: {EMcurrentPA:28310::None}  Barriers to Exercise: {embarrierstoexercise:32606}  Readiness and Motivation  On a scale from 0 to 10 How ready are you to make changes to your eating and physical activity to lose weight? {NUMBER 1-10:22536} How important is it for you to lose weight right now ? {NUMBER 1-10:22536} How confident are  you that you can lose weight if you try? {NUMBER [id number]  Past Medical History  History reviewed. No pertinent past medical history.   Objective    BP 138/78   Pulse 79   Temp 98.4 F (36.9 C)   Ht 6' 2 (1.88 m)   Wt (!) 307 lb (139.3 kg)   SpO2 97%   BMI 39.42 kg/m  He was weighed on the bioimpedance scale: Body mass index is 39.42 kg/m.    General:  Alert, oriented and cooperative. Patient is in no acute distress.  Respiratory: Normal respiratory effort, no problems with respiration noted   Gait: able to ambulate independently  Mental Status: Normal mood and affect. Normal behavior. Normal judgment and thought content.   Diagnostic Data Reviewed  BMET    Component Value Date/Time   NA 141 06/30/2024 0934   K 4.9 06/30/2024 0934   CL 101 06/30/2024 0934   CO2 22 06/30/2024 0934   GLUCOSE 81 06/30/2024 0934   GLUCOSE 81 05/21/2016 1258   BUN 8 06/30/2024 0934   CREATININE 1.08 06/30/2024 0934   CREATININE 1.18 05/21/2016 1258   CALCIUM 9.8 06/30/2024 0934   Lab Results  Component Value Date   HGBA1C 6.1 (H) 06/30/2024   No results found for: INSULIN CBC    Component Value Date/Time   WBC 5.4 06/30/2024 0934   RBC 5.43 06/30/2024 0934   HGB 15.2 06/30/2024 0934   HCT 46.9 06/30/2024 0934   PLT 329 06/30/2024 0934   MCV 86 06/30/2024 0934   MCH 28.0 06/30/2024 0934   MCHC 32.4 06/30/2024 0934   RDW 14.6 06/30/2024 0934   Iron/TIBC/Ferritin/ %Sat No results found for: IRON, TIBC, FERRITIN, IRONPCTSAT Lipid Panel     Component Value Date/Time   CHOL 195 06/30/2024 0934   TRIG 147 06/30/2024 0934   HDL 40 06/30/2024 0934   CHOLHDL 4.9 06/30/2024 0934   LDLCALC 129 (H) 06/30/2024 0934   Hepatic Function Panel  Component Value Date/Time   PROT 7.1 06/30/2024 0934   ALBUMIN 4.4 06/30/2024 0934   AST 26 06/30/2024 0934   ALT 33 06/30/2024 0934   ALKPHOS 80 06/30/2024 0934   BILITOT 0.4 06/30/2024 0934      Component Value  Date/Time   TSH 1.240 06/30/2024 0934    Medications  Outpatient Encounter Medications as of 07/07/2024  Medication Sig   metFORMIN  (GLUCOPHAGE -XR) 500 MG 24 hr tablet Take 1 tablet (500 mg total) by mouth daily with breakfast.   No facility-administered encounter medications on file as of 07/07/2024.     Assessment and Plan   There are no diagnoses linked to this encounter.     Obesity Treatment and Action Plan:  {EMobesityactionplanscribe:28314::Patient will work on garnering support from family and friends to begin weight loss journey.,Will work on eliminating or reducing the presence of highly palatable, calorie dense foods in the home.,Will complete provided nutritional and psychosocial assessment questionnaire before the next appointment.,Will be scheduled for indirect calorimetry to determine resting energy expenditure in a fasting state.  This will allow us  to create a reduced calorie, high-protein meal plan to promote loss of fat mass while preserving muscle mass.,Counseled on the health benefits of losing 5%-15% of total body weight.,Was counseled on nutritional approaches to weight loss and benefits of reducing processed foods and consuming plant-based foods and high quality protein as part of nutritional weight management.,Was counseled on pharmacotherapy and role as an adjunct in weight management. }  Education and Additional resources  He was weighed on the bioimpedance scale and results were discussed and documented in the synopsis.  We discussed obesity as a progressive, chronic disease and the importance of a more detailed evaluation of all the factors contributing to the disease.  We reviewed the basic principles in obesity management.   We discussed the importance of long term lifestyle changes which include nutrition, exercise and behavioral modification as well as the importance of customizing this to his specific health and social needs.  We reviewed  the role of medical interventions including pharmacotherapy and surgical interventions.   We discussed the benefits of reaching a healthier weight to alleviate the symptoms of existing conditions and reduce the risks of the biomechanical, cardiometabolic and psychological effects of obesity.  We reviewed our program approach and philosophy, which are guided by the four pillars of obesity medicine.  We discussed how to prepare for intake appointment and the importance of fasting and avoidance of stimulants for at least 8 hours prior to indirect calorimetry.  Mandrell Vangilder appears to be in the action stage of change and reports being ready to initiate intensive lifestyle and behavioral modifications as part of their weight loss journey.  Attestation  Reviewed by clinician on day of visit: allergies, medications, problem list, medical history, surgical history, family history, social history, and previous encounter notes pertinent to obesity diagnosis.  I have spent *** minutes in the care of the patient today including: {NUMBER 1-10:22536} minutes before the visit reviewing and preparing the chart. *** minutes face-to-face {emfacetoface:32598::assessing and reviewing listed medical problems as outlined in obesity care plan,providing nutritional and behavioral counseling on topics outlined in the obesity care plan,independently interpreting test results and goals of care, as described in assessment and plan,reviewing and discussing biometric information and progress} {NUMBER 1-10:22536} minutes after the visit updating chart and documentation of encounter.   Lucas Parker, MD, [facility], [place]

## 2024-09-08 ENCOUNTER — Encounter (HOSPITAL_BASED_OUTPATIENT_CLINIC_OR_DEPARTMENT_OTHER): Admitting: Family Medicine

## 2024-09-22 ENCOUNTER — Encounter (INDEPENDENT_AMBULATORY_CARE_PROVIDER_SITE_OTHER): Payer: Self-pay

## 2024-09-27 ENCOUNTER — Ambulatory Visit (INDEPENDENT_AMBULATORY_CARE_PROVIDER_SITE_OTHER): Admitting: Internal Medicine

## 2024-09-27 ENCOUNTER — Encounter (INDEPENDENT_AMBULATORY_CARE_PROVIDER_SITE_OTHER): Payer: Self-pay | Admitting: Internal Medicine

## 2024-09-27 VITALS — BP 122/73 | HR 79 | Temp 98.2°F | Ht 74.0 in | Wt 305.0 lb

## 2024-09-27 DIAGNOSIS — R5383 Other fatigue: Secondary | ICD-10-CM

## 2024-09-27 DIAGNOSIS — E785 Hyperlipidemia, unspecified: Secondary | ICD-10-CM

## 2024-09-27 DIAGNOSIS — Z6839 Body mass index (BMI) 39.0-39.9, adult: Secondary | ICD-10-CM | POA: Diagnosis not present

## 2024-09-27 DIAGNOSIS — E66812 Obesity, class 2: Secondary | ICD-10-CM | POA: Diagnosis not present

## 2024-09-27 DIAGNOSIS — Z1331 Encounter for screening for depression: Secondary | ICD-10-CM

## 2024-09-27 DIAGNOSIS — R7303 Prediabetes: Secondary | ICD-10-CM | POA: Diagnosis not present

## 2024-09-27 DIAGNOSIS — R0602 Shortness of breath: Secondary | ICD-10-CM

## 2024-09-27 NOTE — Progress Notes (Signed)
 [address],  [place], [place] [address]  Office: [phone]  /  Fax: [phone]   Subjective   Initial Visit  Jose Malone (MR# [id number]) is a 33 y.o. male who presents for evaluation and treatment of obesity and related comorbidities. Current BMI is Body mass index is 39.16 kg/m. Jose Malone has been struggling with his weight for many years and has been unsuccessful in either losing weight, maintaining weight loss, or reaching his healthy weight goal.  Jose Malone is currently in the action stage of change and ready to dedicate time achieving and maintaining a healthier weight. Jose Malone is interested in becoming our patient and working on intensive lifestyle modifications including (but not limited to) diet and exercise for weight loss.  Weight history:  Obesity Related Diseases and Complications   Obesity Quality of Life and Psychosocial Complications: Reduced health-related quality of life   Cardiometabolic: Prediabetes and/or insulin resistance, Dyslipidemia or hypercholesterolemia, DOE, and Fatigue   Biomechanical: Low back pain    Weight Related History   He was referred by: Friend or Family   When asked what they would like to accomplish? He states: Adopt a healthier eating pattern and lifestyle, Improve energy levels and physical activity, Improve existing medical conditions, and Improve quality of life   Weight history: He had no issues with weight until age 69. He noticed he had increased appetite. He has not tried any specific diets.  Has noted less activity since age 13 due to back pain.   Highest weight: 312 lbs   Contributing factors: family history of obesity, consumption of processed foods, reduced physical activity, and chronic skipping of meals   Prior weight loss attempts: None   Current or previous pharmacotherapy: Is interested in pharmacotherapy   Response to medication: Never tried medications   Current nutrition plan: None   Greatest challenge with  dieting: increased appetite and low energy.   Current level of physical activity: None   Barriers to Exercise: orthopedic problems- low back pain x 3 years- no injury, possible sciatica   Readiness and Motivation   On a scale from 0 to 10 How ready are you to make changes to your eating and physical activity to lose weight? 10 How important is it for you to lose weight right now ? 10 How confident are you that you can lose weight if you try? 9 Assessment and Plan  Discussed the use of AI scribe software for clinical note transcription with the patient, who gave verbal consent to proceed.  History of Present Illness Jose Malone is a 33 year old male who presents for intake for medical weight management  He has a history of prediabetes with an A1c of 6.1 and elevated LDL cholesterol. He has not been on any medications for these conditions and has not considered weight loss surgery. His blood pressure was initially high but normalized without treatment. Recent blood work in July was fasting.  He has a family history of diabetes in his grandmother and obesity in his brother, father, and uncles.  He works full-time in International aid/development worker at [employer], which involves physical activity, and he also coaches football. He lives alone and is currently single.  His dietary habits include eating out frequently, skipping breakfast, and consuming high-calorie foods and drinks such as soda, sweet tea, and tequila. He craves seafood, dislikes pork, and often snacks on candy and chips. He has been trying to cut back on these habits, including reducing the number of honey buns consumed daily. He  drinks a gallon of water daily and has been cutting back on sugary drinks.  He exercises occasionally, having participated in a 5K run organized by his players, but he finds soreness a barrier to regular exercise. He has a goal to reduce his weight to 230 pounds within two years, aiming for a sustainable weight loss of  one to two pounds per week. His current body composition includes a body fat percentage of 32%, equating to 98 pounds of fat, and 97 pounds of muscle. He aims to reduce his body fat percentage to 22% or less.    Nutritional History:  Current nutrition plan: None.  How many times do you eat outside the home: > 7 per week  How often do they skip meals: skips breakfast  What beverages do they drink: regular soda , juice, sweet tea , and alcohol.   Use of artificial sweetners : No  Food intolerances or dislikes: pork.  Food triggers: Boredom and Seeking reward.  Food cravings: Sugary, Starches / Carbohydrates, and Fast Food  Do they struggle with excessive hunger or portion control : Yes    Physical Activity:  Current level of physical activity: NEAT and Other: works at the [employer] at [employer] to Exercise: does not enjoy   Past medical history includes:   Past Medical History:  Diagnosis Date   Back pain    Prediabetes      Objective   BP 122/73   Pulse 79   Temp 98.2 F (36.8 C)   Ht 6' 2 (1.88 m)   Wt (!) 305 lb (138.3 kg)   SpO2 97%   BMI 39.16 kg/m  He was weighed on the bioimpedance scale: Body mass index is 39.16 kg/m.    Anthropometrics:  Vitals Temp: 98.2 F (36.8 C) BP: 122/73 Pulse Rate: 79 SpO2: 97 %   Anthropometric Measurements Height: 6' 2 (1.88 m) Weight: (!) 305 lb (138.3 kg) BMI (Calculated): 39.14 Starting Weight: 305 lb Peak Weight: 316 lb Waist Measurement : 49 inches   Body Composition  Body Fat %: 32.2 % Fat Mass (lbs): 98.4 lbs Muscle Mass (lbs): 197.2 lbs Total Body Water (lbs): 142 lbs Visceral Fat Rating : 16   Other Clinical Data RMR: 2880 Fasting: yes Labs: yes Today's Visit #: 1 Starting Date: 09/27/24    Physical Exam:  General: He is overweight, cooperative, alert, well developed, and in no acute distress. PSYCH: Has normal mood, affect and thought process.   HEENT: EOMI, sclerae are  anicteric. Lungs: Normal breathing effort, no conversational dyspnea. Extremities: No edema.  Neurologic: No gross sensory or motor deficits. No tremors or fasciculations noted.    Diagnostic Data Reviewed  EKG: Normal sinus rhythm, rate 67 bpm. No conduction abnormalities, abnormal Q waves or chamber enlargement.  Indirect Calorimeter completed today shows a VO2 of 417 and a REE of 2880.  His calculated basal metabolic rate is 7077 thus his resting energy expenditure faster than calculated.  Depression Screen  Jose Malone's PHQ-9 score was: 2.     09/27/2024    7:21 AM  Depression screen PHQ 2/9  Decreased Interest 0  Down, Depressed, Hopeless 0  PHQ - 2 Score 0  Altered sleeping 0  Tired, decreased energy 1  Change in appetite 1  Feeling bad or failure about yourself  0  Trouble concentrating 0  Moving slowly or fidgety/restless 0  Suicidal thoughts 0  PHQ-9 Score 2  Difficult doing work/chores Somewhat difficult    Screening  for Sleep Related Breathing Disorders  Jose Malone admits to daytime somnolence and admits to waking up still tired half the time Patient has a history of symptoms of morning fatigue. Jose Malone gets 6 or 7 hours of sleep per night, and states that he has Malone restful sleep. Snoring is present. Apneic episodes are not present. Epworth Sleepiness Score is 0.   BMET    Component Value Date/Time   NA 141 06/30/2024 0934   K 4.9 06/30/2024 0934   CL 101 06/30/2024 0934   CO2 22 06/30/2024 0934   GLUCOSE 81 06/30/2024 0934   GLUCOSE 81 05/21/2016 1258   BUN 8 06/30/2024 0934   CREATININE 1.08 06/30/2024 0934   CREATININE 1.18 05/21/2016 1258   CALCIUM 9.8 06/30/2024 0934   Lab Results  Component Value Date   HGBA1C 6.1 (H) 06/30/2024   No results found for: INSULIN CBC    Component Value Date/Time   WBC 5.4 06/30/2024 0934   RBC 5.43 06/30/2024 0934   HGB 15.2 06/30/2024 0934   HCT 46.9 06/30/2024 0934   PLT 329 06/30/2024 0934   MCV  86 06/30/2024 0934   MCH 28.0 06/30/2024 0934   MCHC 32.4 06/30/2024 0934   RDW 14.6 06/30/2024 0934   Iron/TIBC/Ferritin/ %Sat No results found for: IRON, TIBC, FERRITIN, IRONPCTSAT Lipid Panel     Component Value Date/Time   CHOL 195 06/30/2024 0934   TRIG 147 06/30/2024 0934   HDL 40 06/30/2024 0934   CHOLHDL 4.9 06/30/2024 0934   LDLCALC 129 (H) 06/30/2024 0934   Hepatic Function Panel     Component Value Date/Time   PROT 7.1 06/30/2024 0934   ALBUMIN 4.4 06/30/2024 0934   AST 26 06/30/2024 0934   ALT 33 06/30/2024 0934   ALKPHOS 80 06/30/2024 0934   BILITOT 0.4 06/30/2024 0934      Component Value Date/Time   TSH 1.240 06/30/2024 0934     Assessment and Plan   TREATMENT PLAN FOR OBESITY:  Recommended Dietary Goals  Jose Malone is currently in the action stage of change. As such, his goal is to implement medically supervised obesity management plan.  He has agreed to implement: the Category 4 plan - 1800 kcal per day  Behavioral Intervention  We discussed the following Behavioral Modification Strategies today: increasing lean protein intake to established goals, decreasing simple carbohydrates , increasing vegetables, increasing lower glycemic fruits, increasing fiber rich foods, avoiding skipping meals, increasing water intake, work on meal planning and preparation, reading food labels , keeping healthy foods at home, identifying sources and decreasing liquid calories, decreasing eating out or consumption of processed foods, and making healthy choices when eating convenient foods, planning for success, and better snacking choices  Additional resources provided today: Handout on healthy eating and balanced plate, Handout on complex carbohydrates and lean sources of protein, Category 4 packet, and Handout principles of weight management  Recommended Physical Activity Goals  Jose Malone has been advised to work up to 150 minutes of moderate intensity aerobic activity a  week and strengthening exercises 2-3 times per week for cardiovascular health, weight loss maintenance and preservation of muscle mass.   He has agreed to :  Think about enjoyable ways to increase daily physical activity and overcoming barriers to exercise, Increase physical activity in their day and reduce sedentary time (increase NEAT)., Increase volume of physical activity to a goal of 240 minutes a week, and Combine aerobic and strengthening exercises for efficiency and improved cardiometabolic health.  Medical Interventions and  Pharmacotherapy We will work on building a Therapist, art and behavioral strategies. We will discuss the role of pharmacotherapy as an adjunct at subsequent visits.   ASSOCIATED CONDITIONS ADDRESSED TODAY  Other Fatigue Jose Malone admits to daytime somnolence and admits to waking up still tired half the time Patient has a history of symptoms of morning fatigue. Jose Malone Malone gets 6 or 7 hours of sleep per night, and states that he has Malone restful sleep. Snoring is present. Apneic episodes are not present. Epworth Sleepiness Score is 0. Jose Malone does feel that his weight is causing his energy to be lower than it should be. Fatigue may be related to obesity, depression or many other causes. Labs will be ordered, and in the meanwhile, Jose Malone will focus on self care including making healthy food choices, increasing physical activity and focusing on stress reduction.  Shortness of Breath Jose Malone notes increasing shortness of breath with physical activity and seems to be worsening over time with weight gain. He notes getting out of breath sooner with activity than he used to. This has not gotten worse recently. Jose Malone denies shortness of breath at rest or orthopnea.  Assessment & Plan Other fatigue  Depression screen  Class 2 severe obesity with serious comorbidity and body mass index (BMI) of 39.0 to 39.9 in adult, unspecified obesity type Class 2  obesity with a body fat percentage of 32%, aiming to reduce to 22% or less. Current weight management strategy includes reducing caloric intake to 2000 calories per day from a maintenance level of 2800 calories. Emphasis on reducing intake of calorie-dense and nutrient-poor foods, particularly those high in simple carbohydrates and unhealthy fats. Encouraged to increase intake of protein and fiber to aid in satiety and appetite control. Discussed the importance of meal planning and preparation, as well as the use of technology and apps to track caloric intake and make healthier food choices. Emphasized the importance of sustainable weight loss at a rate of 1-2 pounds per week to avoid muscle loss and ensure long-term maintenance. - Reduce caloric intake to 2000 calories per day. - Increase protein intake to 120 grams per day, aiming for 40 grams per meal. - Reduce intake of simple carbohydrates and unhealthy fats. - Use meal planning and preparation to ensure balanced meals. - Utilize apps like Fitness, Lose It, or My Net Diary to track food intake. - Consider meal replacements like Soylent or prepackaged food services for convenience. - Encourage drinking more water and reducing sugary drinks. - Check insurance coverage for weight loss medications like Zepbound. - Reduce eating out by 50% Prediabetes Most recent A1c is  Lab Results  Component Value Date   HGBA1C 6.1 (H) 06/30/2024    Prediabetes with an A1c of 6.1, indicating elevated average blood glucose levels. Discussed the pathophysiology of insulin resistance and the risk of progression to diabetes if lifestyle changes are not implemented. Emphasized the importance of reducing carbohydrate intake, particularly simple sugars, to manage blood glucose levels and prevent further progression. Highlighted the role of weight loss and dietary changes in improving insulin sensitivity and reducing the risk of developing diabetes. - Reduce intake of  simple carbohydrates and sugary drinks. - Focus on increasing intake of complex carbohydrates with fiber. - Monitor blood glucose levels through dietary changes.  Dyslipidemia Elevated LDL cholesterol, indicating increased risk for cardiovascular disease. Discussed the role of dietary fats in cholesterol levels, particularly the impact of saturated fats on LDL cholesterol. Emphasized the importance of reducing intake of saturated  fats to manage cholesterol levels. Highlighted the potential for lifestyle changes to improve lipid profile without the need for medication at this stage. - Reduce intake of saturated fats from red meats, processed meats, and fried foods. - Monitor cholesterol levels through dietary changes.     Follow-up  He was informed of the importance of frequent follow-up visits to maximize his success with intensive lifestyle modifications for his multiple health conditions. He was informed we would discuss his lab results at his next visit unless there is a critical issue that needs to be addressed sooner. Jose Malone agreed to keep his next visit at the agreed upon time to discuss these results.  Attestation Statement  This is the patient's intake visit at [facility]. The patient's Health Questionnaire was reviewed at length. Included in the packet: current and past health history, medications, allergies, ROS, gynecologic history (women only), surgical history, family history, social history, weight history, weight loss surgery history (for those that have had weight loss surgery), nutritional evaluation, mood and food questionnaire, PHQ9, Epworth questionnaire, sleep habits questionnaire, patient life and health improvement goals questionnaire. These will all be scanned into the patient's chart under media.   During the visit, I independently reviewed the patient's EKG, previous labs, bioimpedance scale results, and indirect calorimetry results. I used this  information to medically tailor a meal plan for the patient that will help him to lose weight and will improve his obesity-related conditions. I performed a medically necessary appropriate examination and/or evaluation. I discussed the assessment and treatment plan with the patient. The patient was provided an opportunity to ask questions and all were answered. The patient agreed with the plan and demonstrated an understanding of the instructions. Labs were ordered at this visit and will be reviewed at the next visit unless critical results need to be addressed immediately. Clinical information was updated and documented in the EMR.   In addition, they received basic education on identification of processed foods and reduction of these, different sources of lean proteins and complex carbohydrates and how to eat balanced by incorporation of whole foods.  Reviewed by clinician on day of visit: allergies, medications, problem list, medical history, surgical history, family history, social history, and previous encounter notes.  I have spent 56 minutes in the care of the patient today including: 5 minutes before the visit reviewing and preparing the chart. 41 minutes face-to-face assessing and reviewing listed medical problems as outlined in obesity care plan, providing nutritional and behavioral counseling on topics outlined in the obesity care plan, independently interpreting test results and goals of care, as described in assessment and plan, reviewing and discussing biometric information and progress, ordering diagnostics - see orders, and reviewing recent labs with patient. 10 minutes after the visit updating chart and documentation of encounter.       Jose Parker, MD

## 2024-09-27 NOTE — Assessment & Plan Note (Signed)
 Elevated LDL cholesterol, indicating increased risk for cardiovascular disease. Discussed the role of dietary fats in cholesterol levels, particularly the impact of saturated fats on LDL cholesterol. Emphasized the importance of reducing intake of saturated fats to manage cholesterol levels. Highlighted the potential for lifestyle changes to improve lipid profile without the need for medication at this stage. - Reduce intake of saturated fats from red meats, processed meats, and fried foods. - Monitor cholesterol levels through dietary changes.

## 2024-09-27 NOTE — Assessment & Plan Note (Signed)
 Class 2 obesity with a body fat percentage of 32%, aiming to reduce to 22% or less. Current weight management strategy includes reducing caloric intake to 2000 calories per day from a maintenance level of 2800 calories. Emphasis on reducing intake of calorie-dense and nutrient-poor foods, particularly those high in simple carbohydrates and unhealthy fats. Encouraged to increase intake of protein and fiber to aid in satiety and appetite control. Discussed the importance of meal planning and preparation, as well as the use of technology and apps to track caloric intake and make healthier food choices. Emphasized the importance of sustainable weight loss at a rate of 1-2 pounds per week to avoid muscle loss and ensure long-term maintenance. - Reduce caloric intake to 2000 calories per day. - Increase protein intake to 120 grams per day, aiming for 40 grams per meal. - Reduce intake of simple carbohydrates and unhealthy fats. - Use meal planning and preparation to ensure balanced meals. - Utilize apps like Fitness, Lose It, or My Net Diary to track food intake. - Consider meal replacements like Soylent or prepackaged food services for convenience. - Encourage drinking more water and reducing sugary drinks. - Check insurance coverage for weight loss medications like Zepbound. - Reduce eating out by 50%

## 2024-09-27 NOTE — Assessment & Plan Note (Signed)
 Most recent A1c is  Lab Results  Component Value Date   HGBA1C 6.1 (H) 06/30/2024    Prediabetes with an A1c of 6.1, indicating elevated average blood glucose levels. Discussed the pathophysiology of insulin resistance and the risk of progression to diabetes if lifestyle changes are not implemented. Emphasized the importance of reducing carbohydrate intake, particularly simple sugars, to manage blood glucose levels and prevent further progression. Highlighted the role of weight loss and dietary changes in improving insulin sensitivity and reducing the risk of developing diabetes. - Reduce intake of simple carbohydrates and sugary drinks. - Focus on increasing intake of complex carbohydrates with fiber. - Monitor blood glucose levels through dietary changes.

## 2024-09-28 LAB — VITAMIN D 25 HYDROXY (VIT D DEFICIENCY, FRACTURES): Vit D, 25-Hydroxy: 12.3 ng/mL — ABNORMAL LOW (ref 30.0–100.0)

## 2024-09-28 LAB — VITAMIN B12: Vitamin B-12: 529 pg/mL (ref 232–1245)

## 2024-09-28 LAB — INSULIN, RANDOM: INSULIN: 39.6 u[IU]/mL — ABNORMAL HIGH (ref 2.6–24.9)

## 2024-10-13 ENCOUNTER — Ambulatory Visit: Admitting: Physician Assistant

## 2024-10-13 ENCOUNTER — Encounter (INDEPENDENT_AMBULATORY_CARE_PROVIDER_SITE_OTHER): Payer: Self-pay | Admitting: Internal Medicine

## 2024-10-13 ENCOUNTER — Encounter: Payer: Self-pay | Admitting: Physician Assistant

## 2024-10-13 ENCOUNTER — Ambulatory Visit (INDEPENDENT_AMBULATORY_CARE_PROVIDER_SITE_OTHER): Admitting: Internal Medicine

## 2024-10-13 VITALS — BP 131/85 | HR 74 | Temp 98.3°F | Ht 74.0 in | Wt 300.0 lb

## 2024-10-13 VITALS — BP 154/89 | HR 66

## 2024-10-13 DIAGNOSIS — Z6839 Body mass index (BMI) 39.0-39.9, adult: Secondary | ICD-10-CM

## 2024-10-13 DIAGNOSIS — E66812 Obesity, class 2: Secondary | ICD-10-CM | POA: Diagnosis not present

## 2024-10-13 DIAGNOSIS — R7303 Prediabetes: Secondary | ICD-10-CM | POA: Diagnosis not present

## 2024-10-13 DIAGNOSIS — E785 Hyperlipidemia, unspecified: Secondary | ICD-10-CM | POA: Diagnosis not present

## 2024-10-13 DIAGNOSIS — L7211 Pilar cyst: Secondary | ICD-10-CM | POA: Diagnosis not present

## 2024-10-13 DIAGNOSIS — E88819 Insulin resistance, unspecified: Secondary | ICD-10-CM | POA: Insufficient documentation

## 2024-10-13 DIAGNOSIS — E559 Vitamin D deficiency, unspecified: Secondary | ICD-10-CM | POA: Diagnosis not present

## 2024-10-13 MED ORDER — VITAMIN D (ERGOCALCIFEROL) 1.25 MG (50000 UNIT) PO CAPS: 50000.0000 [IU] | ORAL_CAPSULE | ORAL | 0 refills | Status: AC

## 2024-10-13 NOTE — Assessment & Plan Note (Signed)
 Most recent A1c is  Lab Results  Component Value Date   HGBA1C 6.1 (H) 06/30/2024   He has an A1c of 6.1, indicating prediabetes, part of the same disease process as insulin resistance, with potential progression to diabetes if unmanaged. Informed about metformin 's benefits in managing blood sugar, delaying diabetes progression, reducing appetite, stabilizing sugar and insulin levels, promoting healthy gut bacteria, and aiding weight loss. Advised trying metformin  again with dinner, especially with reduced sugar intake. - Consider restarting metformin  with dinner to help manage blood sugar levels. - Continue dietary changes to reduce sugar intake and improve blood sugar control.

## 2024-10-13 NOTE — Assessment & Plan Note (Signed)
 Most recent vitamin D levels  Lab Results  Component Value Date   VD25OH 12.3 (L) 09/27/2024     Deficiency state associated with adiposity and may result in leptin resistance, weight gain and fatigue.   Plan: After discussion of benefits, alternative treatment options and side effects patient will be started on vitamin D2 50,000 units 1 tablet weekly for 3-4 months. for a treatment goal level of 50-60 mg/dl. Check levels at that time for response monitoring.

## 2024-10-13 NOTE — Assessment & Plan Note (Signed)
 He has elevated LDL cholesterol levels, which can be improved by reducing saturated fat intake. Discussed sources of saturated fats and the importance of reading food labels for healthier choices. Advised using the YUKA app to identify healthier food options. - Reduce intake of saturated fats from processed meats, fried foods, and high-fat dairy products. - Use the YUKA app to identify healthier food options.

## 2024-10-13 NOTE — Assessment & Plan Note (Signed)
 His HOMA-IR is 7.9 which is elevated. Optimal level < 1.9.   This is complex condition associated with genetics, ectopic fat and lifestyle factors. Insulin resistance may result in increased fat storage, inhibition of the breakdown of fat, cause fluctuations in blood sugar leading to energy crashes and increased cravings for sugary or high carb foods and cause metabolic slowdown making it difficult to lose weight.  This may result in additional weight gain and lead to pre-diabetes and diabetes if untreated. In addition, hyperinsulinemia increases cardiovascular risk, chronic inflammatory response and may increase the risk of obesity related malignancies.  Lab Results  Component Value Date   HGBA1C 6.1 (H) 06/30/2024   Lab Results  Component Value Date   INSULIN 39.6 (H) 09/27/2024   Lab Results  Component Value Date   GLUCOSE 81 06/30/2024   GLUCOSE 81 05/21/2016    We reviewed treatment options which include losing 7 to 10% of body weight, increasing volume of physical activity and maintaining a diet low in saturated fats and with a low glycemic load.  Patient has also been educated on the carb insulin model of obesity.  We also discussed the benefits of metformin .  He had side effects to it in the past but he had a diet high in simple sugars which affects tolerability of medication I would like for him to try medication again now that he is reduced simple and processed sugars.

## 2024-10-13 NOTE — Patient Instructions (Signed)

## 2024-10-13 NOTE — Progress Notes (Signed)
 Office: [phone]  /  Fax: [phone]  Weight Summary and Body Composition Analysis (BIA)  Vitals Temp: 98.3 F (36.8 C) BP: 131/85 Pulse Rate: 74 SpO2: 98 %   Anthropometric Measurements Height: 6' 2 (1.88 m) Weight: 300 lb (136.1 kg) BMI (Calculated): 38.5 Weight at Last Visit: 305 lb Weight Lost Since Last Visit: 5 lb Weight Gained Since Last Visit: 0 lb Starting Weight: 305 lb Total Weight Loss (lbs): 5 lb (2.268 kg) Peak Weight: 316 lb   Body Composition  Body Fat %: 31.4 % Fat Mass (lbs): 94.4 lbs Muscle Mass (lbs): 196.2 lbs Total Body Water (lbs): 140 lbs Visceral Fat Rating : 15    RMR: [id number]  Today's Visit #: 2  Starting Date: 09/27/24   Subjective   Chief Complaint: Obesity  Interval History Discussed the use of AI scribe software for clinical note transcription with the patient, who gave verbal consent to proceed.  History of Present Illness Jose Malone is a 33 year old male with prediabetes who presents for medical weight management.  He has recently lost five pounds due to increased physical activity and dietary changes, such as reducing eating out and sugary drinks. He has not yet started following the meal plan due to needing to use up existing food at home.  He has a history of consuming high-calorie foods like Honey Buns and [employer] Frappuccinos for breakfast but has recently resisted these temptations. He does not have a refrigerator at work, which limits his ability to bring perishable snacks, but he keeps water at work and is considering bringing non-perishable snacks like apples and almonds.  He feels more energetic and less sluggish than before, which he attributes to dietary changes. He has been a picky eater since childhood, particularly disliking creamy textures like yogurt.  His insulin level was recorded at 39, with the normal fasting level being 7. His A1c was 6.1. He has previously used metformin  but experienced  gastrointestinal side effects, particularly when consuming a high-carb diet.  His labs showed a vitamin D deficiency and an elevated LDL cholesterol level. 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 He has a family history of cardiovascular events occurring at a young age.  No current consumption of soft drinks, sodas, and juice.     Challenges affecting patient progress: work schedule, having difficulty with meal prep and planning, need for convenience or prepackaged foods, and limited food variation or intolerances.    Pharmacotherapy for weight management: He is currently taking no anti-obesity medication.   Assessment and Plan   Treatment Plan For Obesity:  Recommended Dietary Goals  Jontez is currently in the action stage of change. As such, his goal is to continue weight management plan. He has agreed to: incorporate prepackaged healthy meals for convenience, incorporate 1-2 meal replacements a day for convenience , continue current plan, and continue to work on implementation of reduced calorie nutrition plan (RCNP)  Behavioral Health and Counseling  We discussed the following behavioral modification strategies today: increasing lean protein intake to established goals, decreasing simple carbohydrates , increasing vegetables, increasing fiber rich foods, avoiding skipping meals, increasing water intake , work on meal planning and preparation, reading food labels , and keeping healthy foods at home.  Additional education and resources provided today: Personalized instruction on the use of YUKA app to quickly assess nutritional value of prepackaged foods.  Recommended Physical Activity Goals  Joeangel has been advised to  work up to 150 minutes of moderate intensity aerobic activity a week and strengthening exercises 2-3 times per week for cardiovascular health, weight loss maintenance and  preservation of muscle mass.  He has agreed to :  Increase the intensity, frequency or duration of strengthening exercises  and Increase the intensity, frequency or duration of aerobic exercises    Medical Interventions and Pharmacotherapy  We discussed various medication options to help Infant with his weight loss efforts and we both agreed to : Restart metfomrin  Associated Conditions Impacted by Obesity Treatment  Assessment & Plan Vitamin D deficiency Most recent vitamin D levels  Lab Results  Component Value Date   VD25OH 12.3 (L) 09/27/2024     Deficiency state associated with adiposity and may result in leptin resistance, weight gain and fatigue.   Plan: After discussion of benefits, alternative treatment options and side effects patient will be started on vitamin D2 50,000 units 1 tablet weekly for 3-4 months. for a treatment goal level of 50-60 mg/dl. Check levels at that time for response monitoring.  Insulin resistance His HOMA-IR is 7.9 which is elevated. Optimal level < 1.9.   This is complex condition associated with genetics, ectopic fat and lifestyle factors. Insulin resistance may result in increased fat storage, inhibition of the breakdown of fat, cause fluctuations in blood sugar leading to energy crashes and increased cravings for sugary or high carb foods and cause metabolic slowdown making it difficult to lose weight.  This may result in additional weight gain and lead to pre-diabetes and diabetes if untreated. In addition, hyperinsulinemia increases cardiovascular risk, chronic inflammatory response and may increase the risk of obesity related malignancies.  Lab Results  Component Value Date   HGBA1C 6.1 (H) 06/30/2024   Lab Results  Component Value Date   INSULIN 39.6 (H) 09/27/2024   Lab Results  Component Value Date   GLUCOSE 81 06/30/2024   GLUCOSE 81 05/21/2016    We reviewed treatment options which include losing 7 to 10% of body weight,  increasing volume of physical activity and maintaining a diet low in saturated fats and with a low glycemic load.  Patient has also been educated on the carb insulin model of obesity.  We also discussed the benefits of metformin .  He had side effects to it in the past but he had a diet high in simple sugars which affects tolerability of medication I would like for him to try medication again now that he is reduced simple and processed sugars.  Prediabetes Most recent A1c is  Lab Results  Component Value Date   HGBA1C 6.1 (H) 06/30/2024   He has an A1c of 6.1, indicating prediabetes, part of the same disease process as insulin resistance, with potential progression to diabetes if unmanaged. Informed about metformin 's benefits in managing blood sugar, delaying diabetes progression, reducing appetite, stabilizing sugar and insulin levels, promoting healthy gut bacteria, and aiding weight loss. Advised trying metformin  again with dinner, especially with reduced sugar intake. - Consider restarting metformin  with dinner to help manage blood sugar levels. - Continue dietary changes to reduce sugar intake and improve blood sugar control.  Class 2 severe obesity with serious comorbidity and body mass index (BMI) of 39.0 to 39.9 in adult, unspecified obesity type He is new to the medical weight management program and has lost 5 pounds since the last visit due to increased physical activity and dietary changes, such as reducing eating out and sugary drinks. He has not yet started the meal  plan but is preparing meals at home. Emphasized the importance of protein intake for satiety and suggested high-protein snacks. Encouraged continued dietary changes and increased exercise, highlighting exercise as a powerful aid in weight loss and overall health improvement. - Continue dietary changes focusing on reducing sugar and carbohydrate intake. - Increase protein intake with meals and snacks. - Incorporate more  exercise, aiming for 240 minutes per week, including cardio and strength training. - Consider using the YUKA app to make healthier grocery choices. Dyslipidemia He has elevated LDL cholesterol levels, which can be improved by reducing saturated fat intake. Discussed sources of saturated fats and the importance of reading food labels for healthier choices. Advised using the YUKA app to identify healthier food options. - Reduce intake of saturated fats from processed meats, fried foods, and high-fat dairy products. - Use the YUKA app to identify healthier food options.      Objective   Physical Exam:  Blood pressure 131/85, pulse 74, temperature 98.3 F (36.8 C), height 6' 2 (1.88 m), weight 300 lb (136.1 kg), SpO2 98%. Body mass index is 38.52 kg/m.  General: He is overweight, cooperative, alert, well developed, and in no acute distress. PSYCH: Has normal mood, affect and thought process.   HEENT: EOMI, sclerae are anicteric. Lungs: Normal breathing effort, no conversational dyspnea. Extremities: No edema.  Neurologic: No gross sensory or motor deficits. No tremors or fasciculations noted.    Diagnostic Data Reviewed:  BMET    Component Value Date/Time   NA 141 06/30/2024 0934   K 4.9 06/30/2024 0934   CL 101 06/30/2024 0934   CO2 22 06/30/2024 0934   GLUCOSE 81 06/30/2024 0934   GLUCOSE 81 05/21/2016 1258   BUN 8 06/30/2024 0934   CREATININE 1.08 06/30/2024 0934   CREATININE 1.18 05/21/2016 1258   CALCIUM 9.8 06/30/2024 0934   Lab Results  Component Value Date   HGBA1C 6.1 (H) 06/30/2024   Lab Results  Component Value Date   INSULIN 39.6 (H) 09/27/2024   Lab Results  Component Value Date   TSH 1.240 06/30/2024   CBC    Component Value Date/Time   WBC 5.4 06/30/2024 0934   RBC 5.43 06/30/2024 0934   HGB 15.2 06/30/2024 0934   HCT 46.9 06/30/2024 0934   PLT 329 06/30/2024 0934   MCV 86 06/30/2024 0934   MCH 28.0 06/30/2024 0934   MCHC 32.4 06/30/2024 0934    RDW 14.6 06/30/2024 0934   Iron Studies No results found for: IRON, TIBC, FERRITIN, IRONPCTSAT Lipid Panel     Component Value Date/Time   CHOL 195 06/30/2024 0934   TRIG 147 06/30/2024 0934   HDL 40 06/30/2024 0934   CHOLHDL 4.9 06/30/2024 0934   LDLCALC 129 (H) 06/30/2024 0934   Hepatic Function Panel     Component Value Date/Time   PROT 7.1 06/30/2024 0934   ALBUMIN 4.4 06/30/2024 0934   AST 26 06/30/2024 0934   ALT 33 06/30/2024 0934   ALKPHOS 80 06/30/2024 0934   BILITOT 0.4 06/30/2024 0934      Component Value Date/Time   TSH 1.240 06/30/2024 0934   Nutritional Lab Results  Component Value Date   VD25OH 12.3 (L) 09/27/2024    Medications: Outpatient Encounter Medications as of 10/13/2024  Medication Sig   Vitamin D, Ergocalciferol, (DRISDOL) 1.25 MG (50000 UNIT) CAPS capsule Take 1 capsule (50,000 Units total) by mouth every 7 (seven) days.   metFORMIN  (GLUCOPHAGE -XR) 500 MG 24 hr tablet Take 1 tablet (500  mg total) by mouth daily with breakfast.   No facility-administered encounter medications on file as of 10/13/2024.     Follow-Up   Return in about 3 weeks (around 11/03/2024) for For Weight Mangement with Dr. Francyne.SABRA He was informed of the importance of frequent follow up visits to maximize his success with intensive lifestyle modifications for his multiple health conditions.  Attestation Statement   Reviewed by clinician on day of visit: allergies, medications, problem list, medical history, surgical history, family history, social history, and previous encounter notes.   I have spent 44 minutes in the care of the patient today including: 2 minutes before the visit reviewing and preparing the chart. 35 minutes face-to-face assessing and reviewing listed medical problems as outlined in obesity care plan, providing nutritional and behavioral counseling on topics outlined in the obesity care plan, counseling regarding anti-obesity medication  as outlined in obesity care plan, independently interpreting test results and goals of care, as described in assessment and plan, reviewing and discussing biometric information and progress, and ordering medications - see orders 7 minutes after the visit updating chart and documentation of encounter.    Lucas Francyne, MD

## 2024-10-13 NOTE — Progress Notes (Signed)
   New Patient Visit   Subjective  Jose Malone is a 33 y.o. male who presents for the following: CYST  Patient states he  has cyst located at the scalp that he  would like to have examined. Patient reports the areas have been there for 1 year. He reports the areas are bothersome, he states its tender to touch.    The following portions of the chart were reviewed this encounter and updated as appropriate: medications, allergies, medical history  Review of Systems:  No other skin or systemic complaints except as noted in HPI or Assessment and Plan.  Objective  Well appearing patient in no apparent distress; mood and affect are within normal limits.   A focused examination was performed of the following areas: scalp        Relevant exam findings are noted in the Assessment and Plan.    Assessment & Plan   Pilar Cyst Exam: Subcutaneous nodule.  Benign-appearing. 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 Discussed that a cyst is a benign growth that can grow over time and sometimes get irritated or inflamed. Recommend observation if it is not bothersome. Discussed option of surgical excision to remove it if it is growing, symptomatic, or other changes noted. Please call for new or changing lesions so they can be evaluated.   PLAN: Referral to [facility] for excision   PILAR CYST   Related Procedures Ambulatory referral to Plastic Surgery  Return if symptoms worsen or fail to improve.  I, Doyce Pan, CMA, am acting as scribe for Thermon Zulauf K, PA-C.   Documentation: I have reviewed the above documentation for accuracy and completeness, and I agree with the above.  Charice Zuno K, PA-C

## 2024-10-13 NOTE — Assessment & Plan Note (Signed)
 He is new to the medical weight management program and has lost 5 pounds since the last visit due to increased physical activity and dietary changes, such as reducing eating out and sugary drinks. He has not yet started the meal plan but is preparing meals at home. Emphasized the importance of protein intake for satiety and suggested high-protein snacks. Encouraged continued dietary changes and increased exercise, highlighting exercise as a powerful aid in weight loss and overall health improvement. - Continue dietary changes focusing on reducing sugar and carbohydrate intake. - Increase protein intake with meals and snacks. - Incorporate more exercise, aiming for 240 minutes per week, including cardio and strength training. - Consider using the YUKA app to make healthier grocery choices.

## 2024-10-27 ENCOUNTER — Ambulatory Visit (INDEPENDENT_AMBULATORY_CARE_PROVIDER_SITE_OTHER)

## 2024-10-27 VITALS — BP 130/86 | HR 76 | Temp 98.0°F | Ht 75.0 in | Wt 305.0 lb

## 2024-10-27 DIAGNOSIS — L729 Follicular cyst of the skin and subcutaneous tissue, unspecified: Secondary | ICD-10-CM

## 2024-10-27 DIAGNOSIS — R220 Localized swelling, mass and lump, head: Secondary | ICD-10-CM

## 2024-10-27 NOTE — Progress Notes (Signed)
 NAME: Jose Malone  MRN: [id number]  DOB: 19-Jan-1991   Referring physician: Knute Thersia Bitters, *  PCP: Knute Thersia Bitters, FNP  CHIEF COMPLAINT: Soft tissue mass of the scalp  HPI:  Jose Malone is a 33 y.o. year old male who presents with a soft tissue mass that is non painful, soft and has enlarged over the last few months slowly.  History of diabetes well-controlled with metformin . Patient does not smoke. No episodes of infection noted.  PMH:  Past Medical History:  Diagnosis Date   Back pain    Prediabetes    PSH:  Past Surgical History:  Procedure Laterality Date   WISDOM TOOTH EXTRACTION       MEDICATIONS:   Current Outpatient Medications:    metFORMIN  (GLUCOPHAGE -XR) 500 MG 24 hr tablet, Take 1 tablet (500 mg total) by mouth daily with breakfast., Disp: 30 tablet, Rfl: 3   Vitamin D, Ergocalciferol, (DRISDOL) 1.25 MG (50000 UNIT) CAPS capsule, Take 1 capsule (50,000 Units total) by mouth every 7 (seven) days., Disp: 16 capsule, Rfl: 0   ALLERGIES:  has no known allergies.   FAMILY HISTORY:  No family history on file.    VITALS: MA as chaperone  Vitals:   10/27/24 0915  BP: 130/86  Pulse: 76  Temp: 98 F (36.7 C)  SpO2: 98%    Constitutional: Good color, good hydration. VSS. Head and Neck: No lymphadenopathy, thyromegaly or masses  Chest: Normal breathing, Normal shape and excursion.  Scalp:   Subcutaneous mass measures 1 cm x 1 cm.  Has an obvious punctum. Currently not infected.  Mobile and not attached to underlying structures.  No basin lymphadenopathy, satellite lesions or in-transit lesions.  ASSESSMENT/PLAN  Assessment & Plan   Pt. presents with a subcutaneous mass most representative of epidermal inclusion cyst.  Today we discussed the risks, benefits and alternatives to mass excision and possible tissue rearrangement. We discussed the alternatives which include continued observation; however, I told the patient that  I do not believe this mass will resolve on its own. We then discussed the benefits of surgical excision which include complete removal of the lesion. We discussed the risks of excision which include seroma, hematoma, infection, bleeding, damage to surrounding healthy tissue, damage to surrounding structures and need for further surgery. We also discussed the risks of hair loss, wound separation and recurrence of the mass. We discussed scar patterns of tissue rearrangement, if needed.  I explained that the mass will be sent to pathology and if it were to be malignant further surgery may be needed. We discussed the risks of anesthesia. The patient has a good understanding of all the risks and benefits, postoperative course and care. We obtained pictures. All questions were answered.    Recommend excision in the treatment room. Risks, benefits and limitations were discussed. Procedure will be precertified and scheduled.  Nil Bolser M. Shiva Karis, MD [facility]

## 2024-11-10 ENCOUNTER — Ambulatory Visit (INDEPENDENT_AMBULATORY_CARE_PROVIDER_SITE_OTHER): Payer: Self-pay | Admitting: Internal Medicine

## 2024-12-27 ENCOUNTER — Encounter (HOSPITAL_BASED_OUTPATIENT_CLINIC_OR_DEPARTMENT_OTHER): Payer: Self-pay

## 2024-12-29 ENCOUNTER — Encounter (HOSPITAL_BASED_OUTPATIENT_CLINIC_OR_DEPARTMENT_OTHER): Admitting: Family Medicine

## 2025-01-13 ENCOUNTER — Encounter (HOSPITAL_BASED_OUTPATIENT_CLINIC_OR_DEPARTMENT_OTHER): Payer: Self-pay | Admitting: Family Medicine

## 2025-01-13 DIAGNOSIS — Z5181 Encounter for therapeutic drug level monitoring: Secondary | ICD-10-CM

## 2025-01-19 NOTE — Addendum Note (Signed)
 Addended by: RAYANN REXENE HERO on: 01/19/2025 10:29 AM   Modules accepted: Orders

## 2025-02-28 ENCOUNTER — Ambulatory Visit: Admitting: Urology

## 2025-03-01 ENCOUNTER — Encounter (HOSPITAL_BASED_OUTPATIENT_CLINIC_OR_DEPARTMENT_OTHER): Admitting: Family Medicine

## 2025-03-16 ENCOUNTER — Encounter (HOSPITAL_BASED_OUTPATIENT_CLINIC_OR_DEPARTMENT_OTHER): Admitting: Family Medicine
